# Patient Record
Sex: Male | Born: 1965 | Race: Black or African American | Hispanic: No | Marital: Single | Smoking: Former smoker
Health system: Southern US, Community
[De-identification: ages and names within clinical notes are randomized; demographics above are authoritative.]

## PROBLEM LIST (undated history)

## (undated) DIAGNOSIS — S069XAA Unspecified intracranial injury with loss of consciousness status unknown, initial encounter: Secondary | ICD-10-CM

## (undated) DIAGNOSIS — S069X9A Unspecified intracranial injury with loss of consciousness of unspecified duration, initial encounter: Secondary | ICD-10-CM

## (undated) DIAGNOSIS — E785 Hyperlipidemia, unspecified: Secondary | ICD-10-CM

## (undated) DIAGNOSIS — I10 Essential (primary) hypertension: Secondary | ICD-10-CM

## (undated) DIAGNOSIS — F1010 Alcohol abuse, uncomplicated: Secondary | ICD-10-CM

## (undated) DIAGNOSIS — F419 Anxiety disorder, unspecified: Secondary | ICD-10-CM

## (undated) HISTORY — DX: Essential (primary) hypertension: I10

## (undated) HISTORY — PX: FACIAL RECONSTRUCTION SURGERY: SHX631

## (undated) HISTORY — DX: Alcohol abuse, uncomplicated: F10.10

## (undated) HISTORY — DX: Hyperlipidemia, unspecified: E78.5

## (undated) HISTORY — DX: Unspecified intracranial injury with loss of consciousness status unknown, initial encounter: S06.9XAA

## (undated) HISTORY — DX: Unspecified intracranial injury with loss of consciousness of unspecified duration, initial encounter: S06.9X9A

---

## 1997-10-02 ENCOUNTER — Emergency Department (HOSPITAL_COMMUNITY): Admission: EM | Admit: 1997-10-02 | Discharge: 1997-10-02 | Payer: Self-pay

## 1997-10-06 ENCOUNTER — Ambulatory Visit (HOSPITAL_COMMUNITY): Admission: RE | Admit: 1997-10-06 | Discharge: 1997-10-06 | Payer: Self-pay

## 1998-07-17 ENCOUNTER — Encounter: Payer: Self-pay | Admitting: Emergency Medicine

## 1998-07-17 ENCOUNTER — Emergency Department (HOSPITAL_COMMUNITY): Admission: EM | Admit: 1998-07-17 | Discharge: 1998-07-17 | Payer: Self-pay | Admitting: Emergency Medicine

## 1998-10-28 ENCOUNTER — Encounter: Payer: Self-pay | Admitting: Emergency Medicine

## 1998-10-28 ENCOUNTER — Emergency Department (HOSPITAL_COMMUNITY): Admission: EM | Admit: 1998-10-28 | Discharge: 1998-10-28 | Payer: Self-pay | Admitting: Emergency Medicine

## 1999-08-19 ENCOUNTER — Encounter: Payer: Self-pay | Admitting: Emergency Medicine

## 1999-08-19 ENCOUNTER — Emergency Department (HOSPITAL_COMMUNITY): Admission: EM | Admit: 1999-08-19 | Discharge: 1999-08-19 | Payer: Self-pay | Admitting: Emergency Medicine

## 2002-05-25 ENCOUNTER — Encounter: Payer: Self-pay | Admitting: Emergency Medicine

## 2002-05-25 ENCOUNTER — Emergency Department (HOSPITAL_COMMUNITY): Admission: EM | Admit: 2002-05-25 | Discharge: 2002-05-25 | Payer: Self-pay | Admitting: Emergency Medicine

## 2004-03-17 ENCOUNTER — Ambulatory Visit: Payer: Self-pay | Admitting: Family Medicine

## 2004-04-07 ENCOUNTER — Ambulatory Visit: Payer: Self-pay | Admitting: Family Medicine

## 2004-05-27 ENCOUNTER — Emergency Department (HOSPITAL_COMMUNITY): Admission: EM | Admit: 2004-05-27 | Discharge: 2004-05-27 | Payer: Self-pay | Admitting: Emergency Medicine

## 2004-07-06 ENCOUNTER — Ambulatory Visit: Payer: Self-pay | Admitting: Sports Medicine

## 2004-12-17 ENCOUNTER — Ambulatory Visit: Payer: Self-pay | Admitting: Family Medicine

## 2005-12-05 ENCOUNTER — Ambulatory Visit: Payer: Self-pay | Admitting: Family Medicine

## 2006-01-04 ENCOUNTER — Ambulatory Visit: Payer: Self-pay | Admitting: Sports Medicine

## 2006-04-11 ENCOUNTER — Ambulatory Visit: Payer: Self-pay | Admitting: Family Medicine

## 2006-05-11 DIAGNOSIS — I10 Essential (primary) hypertension: Secondary | ICD-10-CM | POA: Insufficient documentation

## 2006-05-11 DIAGNOSIS — F411 Generalized anxiety disorder: Secondary | ICD-10-CM | POA: Insufficient documentation

## 2006-05-11 DIAGNOSIS — F528 Other sexual dysfunction not due to a substance or known physiological condition: Secondary | ICD-10-CM | POA: Insufficient documentation

## 2006-05-11 DIAGNOSIS — N400 Enlarged prostate without lower urinary tract symptoms: Secondary | ICD-10-CM | POA: Insufficient documentation

## 2006-09-19 ENCOUNTER — Encounter (INDEPENDENT_AMBULATORY_CARE_PROVIDER_SITE_OTHER): Payer: Self-pay | Admitting: Family Medicine

## 2006-09-19 ENCOUNTER — Ambulatory Visit (HOSPITAL_COMMUNITY): Admission: RE | Admit: 2006-09-19 | Discharge: 2006-09-19 | Payer: Self-pay | Admitting: Family Medicine

## 2006-09-19 ENCOUNTER — Ambulatory Visit: Payer: Self-pay | Admitting: Family Medicine

## 2006-09-19 LAB — CONVERTED CEMR LAB
Bilirubin Urine: NEGATIVE
Blood in Urine, dipstick: NEGATIVE
Glucose, Urine, Semiquant: NEGATIVE
Ketones, urine, test strip: NEGATIVE
Nitrite: NEGATIVE
Protein, U semiquant: NEGATIVE
Specific Gravity, Urine: 1.025
Urobilinogen, UA: 0.2
pH: 6.5

## 2006-09-21 ENCOUNTER — Encounter (INDEPENDENT_AMBULATORY_CARE_PROVIDER_SITE_OTHER): Payer: Self-pay | Admitting: *Deleted

## 2006-09-21 ENCOUNTER — Telehealth (INDEPENDENT_AMBULATORY_CARE_PROVIDER_SITE_OTHER): Payer: Self-pay | Admitting: Family Medicine

## 2006-10-17 ENCOUNTER — Ambulatory Visit (HOSPITAL_COMMUNITY): Admission: RE | Admit: 2006-10-17 | Discharge: 2006-10-17 | Payer: Self-pay | Admitting: Sports Medicine

## 2007-06-19 ENCOUNTER — Ambulatory Visit: Payer: Self-pay | Admitting: Family Medicine

## 2007-06-19 ENCOUNTER — Ambulatory Visit (HOSPITAL_COMMUNITY): Admission: RE | Admit: 2007-06-19 | Discharge: 2007-06-19 | Payer: Self-pay | Admitting: Family Medicine

## 2007-06-19 DIAGNOSIS — F17200 Nicotine dependence, unspecified, uncomplicated: Secondary | ICD-10-CM | POA: Insufficient documentation

## 2007-06-25 ENCOUNTER — Encounter (INDEPENDENT_AMBULATORY_CARE_PROVIDER_SITE_OTHER): Payer: Self-pay | Admitting: Family Medicine

## 2007-06-25 ENCOUNTER — Ambulatory Visit: Payer: Self-pay | Admitting: Sports Medicine

## 2007-06-25 LAB — CONVERTED CEMR LAB
BUN: 11 mg/dL (ref 6–23)
CO2: 25 meq/L (ref 19–32)
Calcium: 9.4 mg/dL (ref 8.4–10.5)
Chloride: 105 meq/L (ref 96–112)
Cholesterol: 204 mg/dL — ABNORMAL HIGH (ref 0–200)
Creatinine, Ser: 1.13 mg/dL (ref 0.40–1.50)
Glucose, Bld: 96 mg/dL (ref 70–99)
HDL: 76 mg/dL (ref >39)
Hgb A1c MFr Bld: 5.9 %
LDL Cholesterol: 119 mg/dL — ABNORMAL HIGH (ref 0–99)
PSA: 0.65 ng/mL (ref 0.10–4.00)
Potassium: 4.2 meq/L (ref 3.5–5.3)
Sodium: 142 meq/L (ref 135–145)
Total CHOL/HDL Ratio: 2.7
Triglycerides: 43 mg/dL (ref <150)
VLDL: 9 mg/dL (ref 0–40)

## 2007-06-26 ENCOUNTER — Encounter (INDEPENDENT_AMBULATORY_CARE_PROVIDER_SITE_OTHER): Payer: Self-pay | Admitting: Family Medicine

## 2007-06-27 ENCOUNTER — Encounter (INDEPENDENT_AMBULATORY_CARE_PROVIDER_SITE_OTHER): Payer: Self-pay | Admitting: Family Medicine

## 2008-04-03 ENCOUNTER — Ambulatory Visit: Payer: Self-pay | Admitting: Family Medicine

## 2008-09-04 ENCOUNTER — Encounter (INDEPENDENT_AMBULATORY_CARE_PROVIDER_SITE_OTHER): Payer: Self-pay | Admitting: Family Medicine

## 2008-09-04 ENCOUNTER — Ambulatory Visit: Payer: Self-pay | Admitting: Family Medicine

## 2008-09-08 LAB — CONVERTED CEMR LAB
ALT: 62 units/L — ABNORMAL HIGH (ref 0–53)
AST: 47 units/L — ABNORMAL HIGH (ref 0–37)
Albumin: 4.7 g/dL (ref 3.5–5.2)
Alkaline Phosphatase: 98 units/L (ref 39–117)
BUN: 9 mg/dL (ref 6–23)
CO2: 27 meq/L (ref 19–32)
Calcium: 9.6 mg/dL (ref 8.4–10.5)
Chloride: 103 meq/L (ref 96–112)
Cholesterol: 211 mg/dL — ABNORMAL HIGH (ref 0–200)
Creatinine, Ser: 1.03 mg/dL (ref 0.40–1.50)
Glucose, Bld: 97 mg/dL (ref 70–99)
HDL: 63 mg/dL (ref >39)
LDL Cholesterol: 134 mg/dL — ABNORMAL HIGH (ref 0–99)
PSA: 0.87 ng/mL (ref 0.10–4.00)
Potassium: 4.4 meq/L (ref 3.5–5.3)
Sodium: 140 meq/L (ref 135–145)
Total Bilirubin: 0.4 mg/dL (ref 0.3–1.2)
Total CHOL/HDL Ratio: 3.3
Total Protein: 7.3 g/dL (ref 6.0–8.3)
Triglycerides: 71 mg/dL (ref <150)
VLDL: 14 mg/dL (ref 0–40)

## 2009-06-12 ENCOUNTER — Encounter: Payer: Self-pay | Admitting: *Deleted

## 2009-06-25 ENCOUNTER — Ambulatory Visit: Payer: Self-pay | Admitting: Family Medicine

## 2009-06-25 DIAGNOSIS — R748 Abnormal levels of other serum enzymes: Secondary | ICD-10-CM | POA: Insufficient documentation

## 2009-06-25 DIAGNOSIS — E785 Hyperlipidemia, unspecified: Secondary | ICD-10-CM | POA: Insufficient documentation

## 2009-06-26 ENCOUNTER — Ambulatory Visit: Payer: Self-pay | Admitting: Family Medicine

## 2009-06-26 ENCOUNTER — Encounter: Payer: Self-pay | Admitting: Family Medicine

## 2009-06-29 ENCOUNTER — Encounter: Payer: Self-pay | Admitting: Family Medicine

## 2009-06-29 LAB — CONVERTED CEMR LAB
ALT: 53 units/L (ref 0–53)
AST: 39 units/L — ABNORMAL HIGH (ref 0–37)
Albumin: 4.6 g/dL (ref 3.5–5.2)
Alkaline Phosphatase: 83 units/L (ref 39–117)
BUN: 12 mg/dL (ref 6–23)
CO2: 26 meq/L (ref 19–32)
Calcium: 9.3 mg/dL (ref 8.4–10.5)
Chloride: 102 meq/L (ref 96–112)
Cholesterol: 191 mg/dL (ref 0–200)
Creatinine, Ser: 0.92 mg/dL (ref 0.40–1.50)
Glucose, Bld: 92 mg/dL (ref 70–99)
HCT: 40.5 % (ref 39.0–52.0)
HDL: 67 mg/dL (ref >39)
Hemoglobin: 13.2 g/dL (ref 13.0–17.0)
LDL Cholesterol: 114 mg/dL — ABNORMAL HIGH (ref 0–99)
MCHC: 32.6 g/dL (ref 30.0–36.0)
MCV: 92.7 fL (ref 78.0–100.0)
Platelets: 250 10*3/uL (ref 150–400)
Potassium: 3.9 meq/L (ref 3.5–5.3)
RBC: 4.37 M/uL (ref 4.22–5.81)
RDW: 13.3 % (ref 11.5–15.5)
Sodium: 139 meq/L (ref 135–145)
Total Bilirubin: 0.4 mg/dL (ref 0.3–1.2)
Total CHOL/HDL Ratio: 2.9
Total Protein: 7.2 g/dL (ref 6.0–8.3)
Triglycerides: 49 mg/dL (ref <150)
VLDL: 10 mg/dL (ref 0–40)
WBC: 5 10*3/uL (ref 4.0–10.5)

## 2009-10-13 ENCOUNTER — Emergency Department (HOSPITAL_COMMUNITY): Admission: EM | Admit: 2009-10-13 | Discharge: 2009-10-13 | Payer: Self-pay | Admitting: Emergency Medicine

## 2010-04-11 LAB — CONVERTED CEMR LAB
Chlamydia, Swab/Urine, PCR: NEGATIVE
GC Probe Amp, Urine: NEGATIVE
HCV Ab: NEGATIVE
Hep B S Ab: POSITIVE — AB
Hepatitis B Surface Ag: NEGATIVE

## 2010-04-15 ENCOUNTER — Encounter: Payer: Self-pay | Admitting: *Deleted

## 2010-04-15 NOTE — Letter (Signed)
Summary: Lab-Male  All     ,     Phone:   Fax:     06/29/2009        Lynita Lombard [address] [place], [place]  [address]   Dear Mr. LEDEE:  We have carefully reviewed the results of your tests noted below and the results are:  PREVENTIVE CARE TESTING:   PSA-Prostate screening, normal <4.0: 0.87 on 09/04/2008-NORMAL     Cholesterol: 191 on 06/26/2009  normal < 200   HDL "good" Cholesterol: 67 on 06/26/2009 normal >35   LDL "bad" Cholesterol: 114  on 06/26/2009 normal < 130    Triglycerides: 49 on 06/26/2009 normal < 150     Your Complete Metabolic panel was normal- your liver enzymes were within normal limits and your kidney function was normal   Your Complete Blood count which test for Anemia was normal     THE ABOVE RESULTS ARE: WITHIN NORMAL LIMITS.    Special recommendations: Please watch your alcohol intake, no more than 4 beers a day is the limit to keep a healthy liver. I will recheck your liver function in 6 months.  If you have any questions, please call. We appreciate being able to work with you.    Sincerely,   Milinda Antis MD Typed by: Milinda Antis MD  Appended Document: Lab-Male mailed.  Appended Document: Lab-Male mailed.

## 2010-04-15 NOTE — Progress Notes (Signed)
Summary: Return call  Phone Note Call from Patient Call back at Home Phone [phone]   Reason for Call: Talk to Nurse Summary of Call: pt states he is returning someone's call regarding lab results Initial call taken by: Haydee Salter,  September 21, 2006 9:11 AM  Follow-up for Phone Call        Called pt and let him know his lab results and sent a prescription for his group B strep UTI to the pharmacy Follow-up by: Levander Campion MD,  September 21, 2006 6:19 PM

## 2010-04-15 NOTE — Assessment & Plan Note (Signed)
Summary: bp med ck,tcb   Vital Signs:  Patient profile:   45 year old male Height:      68.25 inches Weight:      167 pounds BMI:     25.30 Temp:     97.7 degrees F oral Pulse rate:   82 / minute Pulse rhythm:   regular BP sitting:   138 / 100  (left arm)  Vitals Entered By: Modesta Messing LPN (September 04, 2008 10:07 AM) CC: BP meds.  Sore throat.  Pain and numbness in right hip and leg, worse at hs. Is Patient Diabetic? No Pain Assessment Patient in pain? yes     Location: Right hip and leg Intensity: 3 Type: aching Onset of pain  2 weeks, had trouble bearing weight on leg   Primary Care Provider:  Levander Campion MD  CC:  BP meds.  Sore throat.  Pain and numbness in right hip and leg and worse at hs..  History of Present Illness: Patient presents to discuss the following:  1. HTN:  has been out of meds x 2 weeks.  States when he checks BP on the meds usually diastolic is 80s.  Not sure of top number.  No CP, SOB, lightheadedness.  2.  Chest pain:  history of atypical chest pain.  was to have ETT--did not get set up.  Unchanged chest pain  3. right leg pain: had onset of right leg pain described at burning "electricity" pain originating in buttock and radiating to feet.  Has numbness when sitting down on lateral thigh and calf.  X 2 weeks, improving overall but still present.  No injury.  no back pain.  No weakness.  No bowel or bladder problems. Never happened before.    4. sore throat:  describes runny nose, sore throat, cough x 3 days.  No fever or chills.  Improving.  GF sick with similar.  ROS: neg except as above  Physical Exam  General:  Well-developed,well-nourished,in no acute distress; alert,appropriate and cooperative throughout examination thin, muscular Lungs:  Normal respiratory effort, chest expands symmetrically. Lungs are clear to auscultation, no crackles or wheezes. Heart:  Normal rate and regular rhythm. S1 and S2 normal without gallop, murmur,  click, rub or other extra sounds. Msk:  tender to palpation over right piriformis.  Nontender over lumbar spine, lateral thigh.   Negative straight leg raise. Normal and symmetrical internal and external rotation of hips BLE decreased strength on right hip abduction only, all other strength 5/5 BLE 2+ patellar reflexes BLE  Pulses:  R dorsalis pedis normal and L dorsalis pedis normal.   Extremities:  No clubbing, cyanosis, edema, or deformity noted      Impression & Recommendations:  Problem # 1:  HYPERTENSION, BENIGN SYSTEMIC (ICD-401.1) Assessment Deteriorated elevated today but off meds.  Return for RN visit when on meds x 2 weeks to recheck His updated medication list for this problem includes:    Hydrochlorothiazide 25 Mg Tabs (Hydrochlorothiazide) .Marland Kitchen... Take 1 tablet by mouth every morning  Orders: Lipid-[facility] ([address]) Comp Met-[facility] ([address]-91478) [facility]- Est  Level 4 ([address])  Problem # 2:  CHEST PAIN (ICD-786.50)  Orders: ETT (ETT)  atypical, likely MSK vs anxiety, but with risk factors of gender, smoker, HTN, FH will arrange stress test.  Had normal EKG last visit  Problem # 3:  TOBACCO USE (ICD-305.1) counseled to quit  Problem # 4:  LEG PAIN, RIGHT (ICD-729.5) Assessment: New  consistent with sciatica/piriformis syndrome.  No red flags for  hip or back.  Exercises and theraband given.  Meloxicam x 7 days.  Move wallet to front pocket  Orders: [facility]- Est  Level 4 ([address])  Problem # 5:  URI (ICD-465.9) Assessment: New  symptoms consistent with viral URI, supportive care His updated medication list for this problem includes:    Meloxicam 15 Mg Tabs (Meloxicam) .Marland Kitchen... 1 by mouth daily with food x 7 days, then as needed for leg pain  Orders: [facility]- Est  Level 4 ([address])  Complete Medication List: 1)  Hydrochlorothiazide 25 Mg Tabs (Hydrochlorothiazide) .... Take 1 tablet by mouth every morning 2)  Viagra 50 Mg Tabs (Sildenafil citrate) .... Take 1 tablet by mouth as  directed 3)  Meloxicam 15 Mg Tabs (Meloxicam) .Marland Kitchen.. 1 by mouth daily with food x 7 days, then as needed for leg pain  Other Orders: PSA-[facility] ([address]11-[address]1478)  Patient Instructions: 1)  Please schedule a follow-up appointment in 2 weeks for nurse visit for blood pressure check after restarting your medicines. 2)  Make a follow up appointment for blood pressure in 1-2 months. 3)  We will contact you about having the stress test for chest pain. 4)  Perform exercises to relieve the irritation of your sciatic nerve causing your leg pain and numbness three sets twice daily. 5)  Start wearing your wallet in your front pocket. 6)  Take meloxicam once daily x 7 days.  This is $4 at [facility]. 7)  We are checking your cholesterol and prostate today--we will let you know these results. 8)  If your cold symptoms are not better by next week, or you have fever or trouble breathing, return to see [place]. Prescriptions: VIAGRA 50 MG TABS (SILDENAFIL CITRATE) Take 1 tablet by mouth as directed  #10 x 11   Entered and Authorized by:   Levander Campion MD   Signed by:   Levander Campion MD on 09/04/2008   Method used:   Electronically to        [facility] [phone]* (retail)       [address]       [place], [place]  [address]       Ph: [phone]       Fax: [phone]   RxID:   [id number] HYDROCHLOROTHIAZIDE 25 MG TABS (HYDROCHLOROTHIAZIDE) Take 1 tablet by mouth every morning  #31 x 11   Entered and Authorized by:   Levander Campion MD   Signed by:   Levander Campion MD on 09/04/2008   Method used:   Electronically to        [facility] [phone]* (retail)       [address]       [place], [place]  [address]       Ph: [phone]       Fax: [phone]   RxID:   [id number] MELOXICAM 15 MG TABS (MELOXICAM) 1 by mouth daily with food x 7 days, then as needed for leg pain  #15 x 0   Entered and Authorized by:   Levander Campion MD   Signed by:   Levander Campion MD on 09/04/2008   Method  used:   Electronically to        [facility] [phone]* (retail)       [address]       [place], [place]  [address]       Ph: [phone]       Fax: [phone]   RxID:   [id number]

## 2010-04-15 NOTE — Assessment & Plan Note (Signed)
Summary: FU/KH   Vital Signs:  Patient Profile:   45 Years Old Male Height:     68.25 inches Weight:      160.2 pounds Temp:     98.1 degrees F Pulse rate:   75 / minute BP sitting:   138 / 95  (left arm)  Pt. in pain?   no  Vitals Entered By: Jacki Cones RN (April 03, 2008 11:24 AM)                   PCP:  Levander Campion MD  Chief Complaint:  f/u meds, occassional headaches, and and twinges in chest.  History of Present Illness: Patient presents after not being seen for approx 9 months.  We discussed the following:  1. HTN:  did not take HCTZ this AM.  normally takes without problems.  Does not take BP at home.  No shortness of breath, edema.  Does report chest pains--see below.  2. CHest pain:  describes similar chest pains to what he has described before.  THey are substernal, sharp, do not radiate, last a couple of hours, come on randomly with no relation to exertion or eating, do seem to be related to stress. Not pleuritic.  He has significant stress from being unemployed.  Not associated with shortness of breath, nausea, diaphoresis.  exercises regularly by lifting weights and playing bball or football.  No palpitations.   3. anxiety:  has significant history of anxiety, notes worrying all the time.  uses exercise to relieve stress.  stress is mostly from being unemployed.  Prescribed paxil before, but patient did not take, feels like he can manage without meds.  4. tobacco: smoking 2 cigarettes per day--had quit last year.  smokes them at night.  wants to quit       Risk Factors:  Tobacco use:  current    Physical Exam  General:     Well-developed,well-nourished,in no acute distress; alert,appropriate and cooperative throughout examination thin, muscular Neck:     no jvd Chest Wall:     no tenderness.   Lungs:     Normal respiratory effort, chest expands symmetrically. Lungs are clear to auscultation, no crackles or wheezes. Heart:     Normal rate  and regular rhythm. S1 and S2 normal without gallop, murmur, click, rub or other extra sounds. Pulses:     R dorsalis pedis normal and L dorsalis pedis normal.   Extremities:     No clubbing, cyanosis, edema, or deformity noted    Psych:     slightly anxious.      Impression & Recommendations:  Problem # 1:  HYPERTENSION, BENIGN SYSTEMIC (ICD-401.1) Assessment: Unchanged has not taken meds this AM.  repeat BP 142/88.  Recheck in 3 months, encouraged patient to take BP meds before appt His updated medication list for this problem includes:    Hydrochlorothiazide 25 Mg Tabs (Hydrochlorothiazide) .Marland Kitchen... Take 1 tablet by mouth every morning  Orders: [facility]- Est  Level 4 ([address]782)   Problem # 2:  CHEST PAIN (ICD-786.50) Assessment: Unchanged atypical, likely MSK vs anxiety, but with risk factors of gender, smoker, HTN, FH will arrange stress test.  Had normal EKG last visit Orders: [facility]- Est  Level 4 ([address]21)   Problem # 3:  TOBACCO USE (ICD-305.1) Assessment: Deteriorated encouraged cessation Orders: [facility]- Est  Level 4 (30865)   Problem # 4:  ANXIETY (ICD-300.00) Assessment: Unchanged offered paxil, but patient will continue to manage on his own for now.  no depression symptoms His updated medication list for this problem includes:    Paxil 20 Mg Tabs (Paroxetine hcl) .Marland Kitchen... Take 1 tablet by mouth once a day  Orders: [facility]- Est  Level 4 ([address]295)   Complete Medication List: 1)  Hydrochlorothiazide 25 Mg Tabs (Hydrochlorothiazide) .... Take 1 tablet by mouth every morning 2)  Paxil 20 Mg Tabs (Paroxetine hcl) .... Take 1 tablet by mouth once a day 3)  Viagra 50 Mg Tabs (Sildenafil citrate) .... Take 1 tablet by mouth as directed  Other Orders: Influenza Vaccine NON MCR (28413)   Patient Instructions: 1)  Make a follow up appointment in April to recheck blood pressure, cholesterol, and prostate. 2)  Start regular exercise again -- this will help both your heart and your  anxiety. 3)  Continue to work on quitting smoking! 4)  Continue your HCTZ. 5)  [facility] will call you about setting up your stress test.     Influenza Vaccine    Vaccine Type: Fluvax Non-MCR    Site: left deltoid    Mfr: Sanofi Pasteur    Dose: 0.5 ml    Route: IM    Given by: AMY MARTIN RN    Exp. Date: 09/10/2008    Lot #: [id number]    VIS given: 10/05/06 version given April 03, 2008.  Flu Vaccine Consent Questions    Do you have a history of severe allergic reactions to this vaccine? no    Any prior history of allergic reactions to egg and/or gelatin? no    Do you have a sensitivity to the preservative Thimersol? no    Do you have a past history of Guillan-Barre Syndrome? no    Do you currently have an acute febrile illness? no    Have you ever had a severe reaction to latex? no    Vaccine information given and explained to patient? yes
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
## 2010-04-15 NOTE — Letter (Signed)
Summary: Generic Letter  [facility]  [address]   [place], [place] [address]   Phone: [phone]  Fax: [phone]    06/26/2007  AIDENN SKELLENGER [address] [place], [place]  [address]  Dear Mr. MEMOLI,  All of your lab tests came back normal.  Your cholesterol is at goal for you, and you do not have diabetes.  Your prostate test was normal as well.  I look forward to seeing you at your next visit.         Sincerely,   Levander Campion MD [facility]  Appended Document: Generic Letter Letter sent via mail to pt ..................Marland KitchenDelores Pate-Gaddy, CMA (AAMA)

## 2010-04-15 NOTE — Letter (Signed)
Summary: Probation Letter  [facility]  [address]   [place], [place] [address]   Phone: [phone]  Fax: [phone]    06/12/2009  George Schroeder [address] [place], [place]  [address]  Dear [name],  With the goal of better serving all our patients the [facility] is following each patient's missed appointments.  You have missed at least 3 appointments with our practice.If you cannot keep your appointment, we expect you to call at least 24 hours before your appointment time.  Missing appointments prevents other patients from seeing [place] and makes it difficult to provide you with the best possible medical care.      1.   If you miss one more appointment, we will only give you limited medical services. This means we will not call in medication refills, complete a form, or make a referral for you except when you are here for a scheduled office visit.    2.   If you miss 2 or more appointments in the next year, we will dismiss you from our practice.    Our office staff can be reached at [phone] Monday through Friday from 8:30 a.m.-5:00 p.m. and will be glad to schedule your appointment as necessary.    Thank you.   [facility]

## 2010-04-15 NOTE — Miscellaneous (Signed)
Summary: ETT APPT  STRESS TEST SCHEDULED FOR: October 17, 2006 AT 11:15AM AT [facility] INSTRUCTIONS AND APPT MAILED TO PT

## 2010-04-15 NOTE — Assessment & Plan Note (Signed)
Summary: FU MEDS/KH   Vital Signs:  Patient Profile:   45 Years Old Male Height:     68.25 inches Weight:      144 pounds Temp:     98.2 degrees F Pulse rate:   77 / minute BP sitting:   126 / 84  (left arm)  Pt. in pain?   yes    Location:   chest     Intensity:   2    Type:       sharp  Vitals Entered By: Theresia Lo RN (June 19, 2007 11:55 AM)              Is Patient Diabetic? No     PCP:  Levander Campion MD  Chief Complaint:  check up and chest pain.  History of Present Illness: George Schroeder is here to follow up for his HTN.  He also mentions he is having some chest pains. 1. Chest pain:  reports sharp shooting pain in left chest region that last 1-2 seconds.  Not brought on by exertion and not relieved by rest.  Happens spontaneously and goes away.  Not related to eating.  denies GERD symptoms.  Has started working out lately by lifting weights.  Does report that it comes more frequently when he is feeling anxious.  Denies nausea, diaphoresis, shortness of breath, radiation of pain. 2. HTN:  continues on HCTZ without problems.  Denies shortness of breath or edema.  Occasional lightheadedness upon standing.  Does not take BP at home.  Follow low salt diet.  Exercises by walking and lifting weights daily. 3. tobacco abuse: planning to quit today.   4. anxiety: not taking paxil anymore because he does not like to take meds.  Does report increased worry and anxiousness lately, especially regarding money.  His hours at work were recently decreased, which has made it hard for him to pay his bills.      Risk Factors:  Tobacco use:  quit    Physical Exam  General:     Well-developed,well-nourished,in no acute distress; alert,appropriate and cooperative throughout examination thin, muscular Neck:     no jvd Chest Wall:     no tenderness.   Lungs:     Normal respiratory effort, chest expands symmetrically. Lungs are clear to auscultation, no crackles or  wheezes. Heart:     Normal rate and regular rhythm. S1 and S2 normal without gallop, murmur, click, rub or other extra sounds. Pulses:     R dorsalis pedis normal and L dorsalis pedis normal.   Extremities:     No clubbing, cyanosis, edema, or deformity noted    Psych:     Oriented X3, memory intact for recent and remote, normally interactive, good eye contact, and slightly anxious.      Impression & Recommendations:  Problem # 1:  CHEST PAIN (ICD-786.50) Assessment: Unchanged EKG NSR with no ischemic changes.  Chest pain very atypical.  Likely muscle spasm.  Very unlikely cardiac.  Has had this type of chest pain in the past, and we set up ETT.  He did not go, but is willing to do this at this time.  Risk factors include age, male gender, smoking, hypertension. Orders: [facility]- Est  Level 4 (16109)   Problem # 2:  HYPERTENSION, BENIGN SYSTEMIC (ICD-401.1) excellent control. continue HCTZ.  advised to get up slowly to avoid lightheadedness.  If continues, may need to cut back on meds. His updated medication list for this problem includes:  Hydrochlorothiazide 25 Mg Tabs (Hydrochlorothiazide) .Marland Kitchen... Take 1 tablet by mouth every morning  Orders: [facility]- Est  Level 4 (01027)  Future Orders: Lipid-[facility] ([id number]) ... 06/12/2008 Basic Met-[facility] ([id number]) ... 06/12/2008 A1C-[facility] (56433) ... 06/18/2008   Problem # 3:  ANXIETY (ICD-300.00) Assessment: Deteriorated encouraged to restart paxil, as this really helped patient in the past.   His updated medication list for this problem includes:    Paxil 20 Mg Tabs (Paroxetine hcl) .Marland Kitchen... Take 1 tablet by mouth once a day  Orders: [facility]- Est  Level 4 (29518)   Problem # 4:  Screening PSA (ICD-V76.44)  Problem # 5:  Preventive Health Care (ICD-V70.0) Assessment: Comment Only due for cholesterol, PSA, blood sugar check  Problem # 6:  TOBACCO USE (ICD-305.1) Assessment: Improved today is patient's quit date.  encouragement and support  provided. planning to quit on his own, but offered assistance if he desires Orders: [facility]- Est  Level 4 (84166)   Complete Medication List: 1)  Hydrochlorothiazide 25 Mg Tabs (Hydrochlorothiazide) .... Take 1 tablet by mouth every morning 2)  Paxil 20 Mg Tabs (Paroxetine hcl) .... Take 1 tablet by mouth once a day 3)  Viagra 50 Mg Tabs (Sildenafil citrate) .... Take 1 tablet by mouth as directed  Other Orders: Future Orders: PSA-[facility] ([id number]) ... 06/18/2008   Patient Instructions: 1)  Make an appointment with the lab for tomorrow morning to check your cholesterol, blood sugar, and prostate. 2)  We will contact you about having a stress test. 3)  I am confident your chest pain is not coming from your heart. 4)  Keep taking your HCTZ. 5)  Restart paxil to help with nerves. 6)  I am so excited you are going to quit smoking--let us know if we can help you. 7)  I will let you know the results of your lab tests.    Prescriptions: VIAGRA 50 MG TABS (SILDENAFIL CITRATE) Take 1 tablet by mouth as directed  #10 x 11   Entered and Authorized by:   Levander Campion MD   Signed by:   Levander Campion MD on 06/19/2007   Method used:   Print then Give to Patient   RxID:   [id number] PAXIL 20 MG TABS (PAROXETINE HCL) Take 1 tablet by mouth once a day  #31 x 11   Entered and Authorized by:   Levander Campion MD   Signed by:   Levander Campion MD on 06/19/2007   Method used:   Print then Give to Patient   RxID:   [id number] HYDROCHLOROTHIAZIDE 25 MG TABS (HYDROCHLOROTHIAZIDE) Take 1 tablet by mouth every morning  #31 x 11   Entered and Authorized by:   Levander Campion MD   Signed by:   Levander Campion MD on 06/19/2007   Method used:   Print then Give to Patient   RxID:   [id number]  ]

## 2010-04-15 NOTE — Assessment & Plan Note (Signed)
Summary: BP F/U/REFILLS/[facility]   Vital Signs:  Patient Profile:   45 Years Old Male Weight:      146 pounds Temp:     98.2 degrees F Pulse rate:   72 / minute BP sitting:   132 / 93  (left arm)  Pt. in pain?   no  Vitals Entered By: Jacki Cones RN (September 19, 2006 1:38 PM)                PCP:  Levander Campion MD  Chief Complaint:  f/u bp and has chest tightness twice in last 2 days.  History of Present Illness: 1. pt describes substernal chest tightness that started 2 days ago, does not radiate.  Possibly exacerbated by eating.  Patient does endorse some acid reflex symptoms. Not associated with nausea, diaphoresis, or shortness of breath.  Not brought on by exertion. No palpitations. Not relieved by rest.  Sometimes exacerbated by flexing chest muscles. This AM, pain was gone when patient awakened, and has not returned.  2. Patient is concerned about STDs, as has had mulitple partners in the past with possible exposure to STDs. No symptoms. would like testing.  3. UTI: patient had a UTI in Jan and did not pick up his antibiotic because he was concerned it would interact with his HCTZ.  Currently having some dysuria.  No fevers or chills.      Risk Factors:  Tobacco use:  current    Cigarettes:  Yes -- 0.25 ppd pack(s) per day    Counseled to quit/cut down tobacco use:  yes    Physical Exam  General:     Well-developed,well-nourished,in no acute distress; alert,appropriate and cooperative throughout examination Neck:     supple, full ROM, and no JVD.   Chest Wall:     no tenderness.   Lungs:     Normal respiratory effort, chest expands symmetrically. Lungs are clear to auscultation, no crackles or wheezes. Heart:     Normal rate and regular rhythm. S1 and S2 normal without gallop, murmur, click, rub or other extra sounds. Abdomen:     Bowel sounds positive,abdomen soft and non-tender without masses, organomegaly or hernias noted. Pulses:     2+ DP pulses  bilaterally Extremities:     No clubbing, cyanosis, edema, or deformity noted with normal full range of motion of all joints.   Psych:     moderately anxious.      Impression & Recommendations:  Problem # 1:  CHEST PAIN (ICD-786.50) Assessment: New Doubt pain is cardiac in nature given characteristics, but given male gender, smoking, and hypertension, would recommend exercise treadmill test to rule out cardiac ischemia.  Will get baseline EKG today. Currently chest pain free.  Most likely GERD vs anxiety vs MSK. recommend trying prilosec. Orders: ETT (ETT) [facility]- Est  Level 4 (16109)   Problem # 2:  HX, URINARY INFECTION (ICD-V13.02) will recheck urine culture today, and prescribe abx if necessary. Explained importance of treating infection, and safety of antibiotic. Orders: Urinalysis-[facility] (00000) Urine Culture-[facility] ([id number]) [facility]- Est  Level 4 (99214)   Problem # 3:  EXPOSURE TO COMMUNICABLE DISEASE NOS (ICD-V01.9) Given possible exposure in the past, will check for stds. Orders: HIV-[facility] ([id number]) Hep C Ab-[facility] ([id number]) Hep Bs Ag-[facility] (96295-28413) Hep Bs Ab-[facility] (24401-02725) RPR-[facility] (36644-03474) GC/Chlamydia-[facility] (87591/87491) [facility]- Est  Level 4 (25956)    Patient Instructions: 1)  I will call you about your test results. 2)  Continue your HCTZ as before. 3)  Try over the counter prilosec (omeprazole) for reflux. 4)  Amy will scedule your exercise stress test. 5)  Tobacco is very bad for your health and your loved ones! You Should stop smoking!. 6)  Stop Smoking Tips: Choose a Quit date. Cut down before the Quit date. decide what you will do as a substitute when you feel the urge to smoke(gum,toothpick,exercise).        Laboratory Results   Urine Tests  Date/Time Recieved: September 19, 2006 2:19 PM  Date/Time Reported: September 19, 2006 3:05 PM   Routine Urinalysis   Color: yellow Appearance: Clear Glucose: negative   (Normal Range:  Negative) Bilirubin: negative   (Normal Range: Negative) Ketone: negative   (Normal Range: Negative) Spec. Gravity: 1.025   (Normal Range: 1.003-1.035) Blood: negative   (Normal Range: Negative) pH: 6.5   (Normal Range: 5.0-8.0) Protein: negative   (Normal Range: Negative) Urobilinogen: 0.2   (Normal Range: 0-1) Nitrite: negative   (Normal Range: Negative) Leukocyte Esterace: small   (Normal Range: Negative)  Urine Microscopic WBC/hpf: 5-10 RBC/hpf: occ Bacteria: trace Mucous: small Epithelial: rare    Comments: urine sent for culture ...................................................................DONNA [facility]  September 19, 2006 3:05 PM

## 2010-05-28 LAB — POCT URINALYSIS DIPSTICK
Bilirubin Urine: NEGATIVE
Glucose, UA: NEGATIVE mg/dL
Hgb urine dipstick: NEGATIVE
Nitrite: NEGATIVE
Protein, ur: 30 mg/dL — AB
Specific Gravity, Urine: 1.025 (ref 1.005–1.030)
Urobilinogen, UA: 2 mg/dL — ABNORMAL HIGH (ref 0.0–1.0)
pH: 6 (ref 5.0–8.0)

## 2010-05-28 LAB — URINE CULTURE
Colony Count: 25000
Culture  Setup Time: 201108030424

## 2010-10-11 ENCOUNTER — Other Ambulatory Visit: Payer: Self-pay | Admitting: Family Medicine

## 2010-10-11 MED ORDER — HYDROCHLOROTHIAZIDE 25 MG PO TABS: 25.0000 mg | ORAL_TABLET | Freq: Every day | ORAL | Status: DC

## 2010-11-28 ENCOUNTER — Emergency Department (HOSPITAL_COMMUNITY)
Admission: EM | Admit: 2010-11-28 | Discharge: 2010-11-29 | Disposition: A | Discharge status: Other Acute Inpatient Hospital | Payer: Self-pay | Source: Home / Self Care | Attending: Emergency Medicine | Admitting: Emergency Medicine

## 2010-11-28 ENCOUNTER — Emergency Department (HOSPITAL_COMMUNITY): Payer: Self-pay

## 2010-11-28 DIAGNOSIS — R0902 Hypoxemia: Secondary | ICD-10-CM | POA: Insufficient documentation

## 2010-11-28 DIAGNOSIS — R079 Chest pain, unspecified: Secondary | ICD-10-CM | POA: Insufficient documentation

## 2010-11-28 DIAGNOSIS — R000 Tachycardia, unspecified: Secondary | ICD-10-CM | POA: Insufficient documentation

## 2010-11-28 DIAGNOSIS — R059 Cough, unspecified: Secondary | ICD-10-CM | POA: Insufficient documentation

## 2010-11-28 DIAGNOSIS — R05 Cough: Secondary | ICD-10-CM | POA: Insufficient documentation

## 2010-11-28 DIAGNOSIS — B9789 Other viral agents as the cause of diseases classified elsewhere: Secondary | ICD-10-CM | POA: Insufficient documentation

## 2010-11-28 DIAGNOSIS — R509 Fever, unspecified: Secondary | ICD-10-CM | POA: Insufficient documentation

## 2010-11-28 DIAGNOSIS — R51 Headache: Secondary | ICD-10-CM | POA: Insufficient documentation

## 2010-11-28 DIAGNOSIS — I10 Essential (primary) hypertension: Secondary | ICD-10-CM | POA: Insufficient documentation

## 2010-11-28 LAB — COMPREHENSIVE METABOLIC PANEL
ALT: 42 U/L (ref 0–53)
AST: 32 U/L (ref 0–37)
Albumin: 3.7 g/dL (ref 3.5–5.2)
Alkaline Phosphatase: 113 U/L (ref 39–117)
BUN: 12 mg/dL (ref 6–23)
CO2: 26 mEq/L (ref 19–32)
Calcium: 9.1 mg/dL (ref 8.4–10.5)
Chloride: 95 mEq/L — ABNORMAL LOW (ref 96–112)
Creatinine, Ser: 0.96 mg/dL (ref 0.50–1.35)
GFR calc Af Amer: >60 mL/min (ref >60)
GFR calc non Af Amer: >60 mL/min (ref >60)
Glucose, Bld: 103 mg/dL — ABNORMAL HIGH (ref 70–99)
Potassium: 3.3 mEq/L — ABNORMAL LOW (ref 3.5–5.1)
Sodium: 134 mEq/L — ABNORMAL LOW (ref 135–145)
Total Bilirubin: 0.5 mg/dL (ref 0.3–1.2)
Total Protein: 8.1 g/dL (ref 6.0–8.3)

## 2010-11-28 LAB — DIFFERENTIAL
Basophils Absolute: 0 10*3/uL (ref 0.0–0.1)
Basophils Relative: 0 % (ref 0–1)
Eosinophils Absolute: 0 10*3/uL (ref 0.0–0.7)
Eosinophils Relative: 0 % (ref 0–5)
Lymphocytes Relative: 12 % (ref 12–46)
Lymphs Abs: 1.6 10*3/uL (ref 0.7–4.0)
Monocytes Absolute: 1.2 10*3/uL — ABNORMAL HIGH (ref 0.1–1.0)
Monocytes Relative: 9 % (ref 3–12)
Neutro Abs: 10.3 10*3/uL — ABNORMAL HIGH (ref 1.7–7.7)
Neutrophils Relative %: 78 % — ABNORMAL HIGH (ref 43–77)

## 2010-11-28 LAB — CBC
HCT: 41.7 % (ref 39.0–52.0)
Hemoglobin: 14.4 g/dL (ref 13.0–17.0)
MCH: 31.9 pg (ref 26.0–34.0)
MCHC: 34.5 g/dL (ref 30.0–36.0)
MCV: 92.3 fL (ref 78.0–100.0)
Platelets: 225 10*3/uL (ref 150–400)
RBC: 4.52 MIL/uL (ref 4.22–5.81)
RDW: 13.9 % (ref 11.5–15.5)
WBC: 13.1 10*3/uL — ABNORMAL HIGH (ref 4.0–10.5)

## 2010-11-28 LAB — LIPASE, BLOOD: Lipase: 17 U/L (ref 11–59)

## 2010-11-29 ENCOUNTER — Inpatient Hospital Stay (HOSPITAL_COMMUNITY): Payer: Self-pay

## 2010-11-29 ENCOUNTER — Inpatient Hospital Stay (HOSPITAL_COMMUNITY)
Admission: RE | Admit: 2010-11-29 | Discharge: 2010-12-02 | DRG: 195 | Disposition: A | Discharge status: Home Health | Payer: Self-pay | Source: Ambulatory Visit | Attending: Family Medicine | Admitting: Family Medicine

## 2010-11-29 ENCOUNTER — Encounter: Payer: Self-pay | Admitting: Family Medicine

## 2010-11-29 DIAGNOSIS — I10 Essential (primary) hypertension: Secondary | ICD-10-CM | POA: Diagnosis present

## 2010-11-29 DIAGNOSIS — F17200 Nicotine dependence, unspecified, uncomplicated: Secondary | ICD-10-CM | POA: Diagnosis present

## 2010-11-29 DIAGNOSIS — J159 Unspecified bacterial pneumonia: Secondary | ICD-10-CM

## 2010-11-29 DIAGNOSIS — F1010 Alcohol abuse, uncomplicated: Secondary | ICD-10-CM | POA: Diagnosis present

## 2010-11-29 DIAGNOSIS — J189 Pneumonia, unspecified organism: Principal | ICD-10-CM | POA: Diagnosis present

## 2010-11-29 DIAGNOSIS — Z79899 Other long term (current) drug therapy: Secondary | ICD-10-CM

## 2010-11-29 DIAGNOSIS — E876 Hypokalemia: Secondary | ICD-10-CM | POA: Diagnosis present

## 2010-11-29 DIAGNOSIS — R000 Tachycardia, unspecified: Secondary | ICD-10-CM | POA: Diagnosis present

## 2010-11-29 DIAGNOSIS — Z23 Encounter for immunization: Secondary | ICD-10-CM

## 2010-11-29 DIAGNOSIS — R0902 Hypoxemia: Secondary | ICD-10-CM | POA: Diagnosis present

## 2010-11-29 LAB — COMPREHENSIVE METABOLIC PANEL
ALT: 32 U/L (ref 0–53)
AST: 23 U/L (ref 0–37)
Albumin: 3.1 g/dL — ABNORMAL LOW (ref 3.5–5.2)
Alkaline Phosphatase: 97 U/L (ref 39–117)
BUN: 11 mg/dL (ref 6–23)
CO2: 29 mEq/L (ref 19–32)
Calcium: 8.7 mg/dL (ref 8.4–10.5)
Chloride: 97 mEq/L (ref 96–112)
Creatinine, Ser: 0.98 mg/dL (ref 0.50–1.35)
GFR calc Af Amer: >60 mL/min (ref >60)
GFR calc non Af Amer: >60 mL/min (ref >60)
Glucose, Bld: 101 mg/dL — ABNORMAL HIGH (ref 70–99)
Potassium: 3.4 mEq/L — ABNORMAL LOW (ref 3.5–5.1)
Sodium: 134 mEq/L — ABNORMAL LOW (ref 135–145)
Total Bilirubin: 0.6 mg/dL (ref 0.3–1.2)
Total Protein: 7.1 g/dL (ref 6.0–8.3)

## 2010-11-29 LAB — CBC
HCT: 39.5 % (ref 39.0–52.0)
Hemoglobin: 13.5 g/dL (ref 13.0–17.0)
MCH: 31.7 pg (ref 26.0–34.0)
MCHC: 34.2 g/dL (ref 30.0–36.0)
MCV: 92.7 fL (ref 78.0–100.0)
Platelets: 204 10*3/uL (ref 150–400)
RBC: 4.26 MIL/uL (ref 4.22–5.81)
RDW: 13.7 % (ref 11.5–15.5)
WBC: 9.4 10*3/uL (ref 4.0–10.5)

## 2010-11-29 LAB — INFLUENZA PANEL BY PCR (TYPE A & B)
H1N1 flu by pcr: NOT DETECTED
Influenza A By PCR: NEGATIVE
Influenza B By PCR: NEGATIVE

## 2010-11-29 LAB — GRAM STAIN

## 2010-11-29 LAB — URINALYSIS, ROUTINE W REFLEX MICROSCOPIC
Bilirubin Urine: NEGATIVE
Glucose, UA: NEGATIVE mg/dL
Hgb urine dipstick: NEGATIVE
Ketones, ur: NEGATIVE mg/dL
Leukocytes, UA: NEGATIVE
Nitrite: NEGATIVE
Protein, ur: NEGATIVE mg/dL
Specific Gravity, Urine: 1.02 (ref 1.005–1.030)
Urobilinogen, UA: 1 mg/dL (ref 0.0–1.0)
pH: 7 (ref 5.0–8.0)

## 2010-11-29 LAB — PROTIME-INR
INR: 1.08 (ref 0.00–1.49)
Prothrombin Time: 14.2 seconds (ref 11.6–15.2)

## 2010-11-29 LAB — CARDIAC PANEL(CRET KIN+CKTOT+MB+TROPI)
CK, MB: 2.1 ng/mL (ref 0.3–4.0)
Relative Index: 1 (ref 0.0–2.5)
Total CK: 210 U/L (ref 7–232)
Troponin I: <0.3 ng/mL (ref <0.30)

## 2010-11-29 LAB — APTT: aPTT: 35 seconds (ref 24–37)

## 2010-11-29 LAB — CK TOTAL AND CKMB (NOT AT ARMC)
CK, MB: 1.3 ng/mL (ref 0.3–4.0)
Relative Index: 0.7 (ref 0.0–2.5)
Total CK: 179 U/L (ref 7–232)

## 2010-11-29 LAB — TROPONIN I: Troponin I: <0.3 ng/mL (ref <0.30)
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
## 2010-11-30 LAB — URINE CULTURE
Colony Count: NO GROWTH
Culture  Setup Time: 201209170639
Culture: NO GROWTH

## 2010-11-30 LAB — CBC
HCT: 37.4 % — ABNORMAL LOW (ref 39.0–52.0)
Hemoglobin: 12.8 g/dL — ABNORMAL LOW (ref 13.0–17.0)
MCH: 31.3 pg (ref 26.0–34.0)
MCHC: 34.2 g/dL (ref 30.0–36.0)
MCV: 91.4 fL (ref 78.0–100.0)
Platelets: 182 10*3/uL (ref 150–400)
RBC: 4.09 MIL/uL — ABNORMAL LOW (ref 4.22–5.81)
RDW: 13.6 % (ref 11.5–15.5)
WBC: 10.3 10*3/uL (ref 4.0–10.5)

## 2010-11-30 LAB — BASIC METABOLIC PANEL
BUN: 10 mg/dL (ref 6–23)
CO2: 24 mEq/L (ref 19–32)
Calcium: 8.7 mg/dL (ref 8.4–10.5)
Chloride: 98 mEq/L (ref 96–112)
Creatinine, Ser: 1.03 mg/dL (ref 0.50–1.35)
GFR calc Af Amer: >60 mL/min (ref >60)
GFR calc non Af Amer: >60 mL/min (ref >60)
Glucose, Bld: 112 mg/dL — ABNORMAL HIGH (ref 70–99)
Potassium: 4 mEq/L (ref 3.5–5.1)
Sodium: 132 mEq/L — ABNORMAL LOW (ref 135–145)

## 2010-12-01 LAB — CBC
HCT: 37.7 % — ABNORMAL LOW (ref 39.0–52.0)
Hemoglobin: 12.8 g/dL — ABNORMAL LOW (ref 13.0–17.0)
MCH: 31 pg (ref 26.0–34.0)
MCHC: 34 g/dL (ref 30.0–36.0)
MCV: 91.3 fL (ref 78.0–100.0)
Platelets: 214 10*3/uL (ref 150–400)
RBC: 4.13 MIL/uL — ABNORMAL LOW (ref 4.22–5.81)
RDW: 13.8 % (ref 11.5–15.5)
WBC: 10.9 10*3/uL — ABNORMAL HIGH (ref 4.0–10.5)

## 2010-12-01 LAB — BASIC METABOLIC PANEL
BUN: 11 mg/dL (ref 6–23)
CO2: 24 mEq/L (ref 19–32)
Calcium: 8.7 mg/dL (ref 8.4–10.5)
Chloride: 99 mEq/L (ref 96–112)
Creatinine, Ser: 0.93 mg/dL (ref 0.50–1.35)
GFR calc Af Amer: >60 mL/min (ref >60)
GFR calc non Af Amer: >60 mL/min (ref >60)
Glucose, Bld: 102 mg/dL — ABNORMAL HIGH (ref 70–99)
Potassium: 4.1 mEq/L (ref 3.5–5.1)
Sodium: 134 mEq/L — ABNORMAL LOW (ref 135–145)

## 2010-12-01 LAB — HIV ANTIBODY (ROUTINE TESTING W REFLEX): HIV: NONREACTIVE

## 2010-12-02 ENCOUNTER — Inpatient Hospital Stay (HOSPITAL_COMMUNITY): Payer: Self-pay

## 2010-12-03 NOTE — Discharge Summary (Signed)
Physician Discharge Summary  Patient ID: George Schroeder MRN: [id number] DOB/AGE: 1965-08-21 45 y.o.  Admit date: 11/29/10 Discharge date: 12/02/10  Admission Diagnoses: Respiratory Distress   Discharge Diagnoses: 1.  Pneumonia, aspiration 2. Hypertension 3. Alcohol Abuse  Discharged Condition: good  Hospital Course: Mr. Limas presented to the ED at [facility] with a one day history of fever after heaving alcohol consumption. He was found to be febrile and in mild respiratory distress. A chest X-ray was negative for pneumonia on 9/16. However, on 9/17 a chest X-ray demonstrated a left lower lobe opacity consistent. The patient was started on Rocephin and Azithromycin for community acquired pneumonia. However, the patient continued to be febrile to 103. Therefore, Clindamycin was started for suspected aspiration pneumonia. The patient was also hypertensive throughout his hospitalization despite taking Metoprolol 50mg  BID. Therefore, HCTZ was added to his regimen.   Consults: Social work and case management   Significant Diagnostic Studies: microbiology: blood culture: NGTD, but final on 12/04/10 and radiology: CXR: infiltrates: lower lobe on the left on 9/17,   Treatments: antibiotics: Rocephin 1 gm IV x 4 days, Azithromycin 500mg   BID x 4 days, Clindamycin 600 mg IV BID x 3 days  D/C Meds:  1. Ciprofloxacin 500 mg PO BID x 7 days 2. Clindamycin 300 mg PO, 2 tabs TID x 7 days 3. Metoprolol 25 mg PO BID 4. HCTZ 25mg  daily 5. Sildenafil 50 mg daily as needed   Discharge Exam: At discharge the T: 98.3, BP: 130/91 RR:18 O2 96% on RA P: 82   Disposition: Home , the patient will also follow-up with the [facility] for application for government resources;   Discharge Orders    Future Appointments: Provider: Department: Dept Phone: Center:   12/14/2010 1:45 PM Clementeen Graham [facility] Resident [phone] [facility]         Signed: Mat Carne 12/03/2010,  5:31 AM

## 2010-12-05 LAB — CULTURE, BLOOD (ROUTINE X 2)
Culture  Setup Time: 201209172220
Culture  Setup Time: 201209172220
Culture: NO GROWTH
Culture: NO GROWTH

## 2010-12-06 NOTE — Discharge Summary (Addendum)
  George Schroeder, George Schroeder                ACCOUNT NO.:  [id number]  MEDICAL RECORD NO.:  [id number]  LOCATION:  [facility]                         FACILITY:  [facility]  PHYSICIAN:  Santiago Bumpers. Hensel, M.D.DATE OF BIRTH:  1965-11-11  DATE OF ADMISSION:  11/29/2010 DATE OF DISCHARGE:  12/02/2010                              DISCHARGE SUMMARY   ADMISSION DIAGNOSIS:  Respiratory distress.  DISCHARGE DIAGNOSES: 1. Aspiration pneumonia. 2. Hypertension. 3. Alcohol abuse.  DISCHARGE CONDITION:  Good.  HOSPITAL COURSE:  George Schroeder presented to the ED of [facility] with a 1-day history of fever after heavy alcohol consumption.  He was found to be febrile and mild respiratory distress.  A chest x-ray was negative for pneumonia on September 16.  However, on September 17, a chest x-ray demonstrated left lower lobe opacity.  The patient was started on Rocephin and azithromycin for community-acquired pneumonia. However, the patient continued to be febrile to 103 degrees; therefore, clindamycin was started for suspected aspiration pneumonia.  The patient was also hypertensive throughout his hospitalization despite taking metoprolol 50 mg b.i.d., therefore, HCTZ was added to his regimen.  CONSULTATIONS:  Social work and case Insurance account manager.  SIGNIFICANT DIAGNOSTIC STUDIES:  Microbiology:  Blood cultures showed no growth today, but will be finalized on December 04, 2010. Radiology:  Chest x-ray showed infiltrates of the left lower lobe on September 17.  HOSPITAL TREATMENT:  Antibiotics:  Rocephin 1 gram IV x4 days, azithromycin 500 mg b.i.d. x4 days, and clindamycin 600 mg IV b.i.d. x3 days.  DISCHARGE MEDICATIONS: 1. Ciprofloxacin 500 mg by mouth twice daily for 7 days. 2. Clindamycin 300 mg take 2 tablets three times daily for 7 days. 3. Metoprolol 25 mg take 1 tablet by mouth twice daily. 4. Hydrochlorothiazide 25 mg 1 tablet by mouth daily. 5. Viagra 50 mg 1 tablet by mouth daily as  needed.  PHYSICAL EXAMINATION:  At the time of discharge; temperature is 98.3, blood pressure 130/91, respiratory rate 18, oxygen saturation 96% on room air, pulse was 82.  DISPOSITION:  The patient was discharged to home.  He will follow-up with [facility] network for application for government resources.  Additionally, he has a follow-up appointment with his primary care physician, Dr. Clementeen Graham at the [facility] on December 14, 2010.    ______________________________ Mat Carne, MD   ______________________________ Santiago Bumpers. Leveda Anna, M.D.    EW/MEDQ  D:  12/03/2010  T:  12/03/2010  Job:  [id number]  Electronically Signed by Mat Carne  on 01/07/2011 09:43:52 PM Electronically Signed by Doralee Albino M.D. on 01/13/2011 10:10:56 AM
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
## 2010-12-14 ENCOUNTER — Encounter: Payer: Self-pay | Admitting: Family Medicine

## 2010-12-14 ENCOUNTER — Ambulatory Visit (INDEPENDENT_AMBULATORY_CARE_PROVIDER_SITE_OTHER): Payer: Self-pay | Admitting: Family Medicine

## 2010-12-14 VITALS — BP 136/96 | HR 90 | Temp 98.3°F | Wt 160.0 lb

## 2010-12-14 DIAGNOSIS — J690 Pneumonitis due to inhalation of food and vomit: Secondary | ICD-10-CM | POA: Insufficient documentation

## 2010-12-14 DIAGNOSIS — I10 Essential (primary) hypertension: Secondary | ICD-10-CM

## 2010-12-14 DIAGNOSIS — F1010 Alcohol abuse, uncomplicated: Secondary | ICD-10-CM | POA: Insufficient documentation

## 2010-12-14 DIAGNOSIS — F819 Developmental disorder of scholastic skills, unspecified: Secondary | ICD-10-CM

## 2010-12-14 MED ORDER — HYDROCHLOROTHIAZIDE 25 MG PO TABS: 25.0000 mg | ORAL_TABLET | Freq: Every day | ORAL | Status: DC

## 2010-12-14 MED ORDER — METOPROLOL TARTRATE 25 MG PO TABS: 25.0000 mg | ORAL_TABLET | Freq: Two times a day (BID) | ORAL | Status: DC

## 2010-12-14 NOTE — Assessment & Plan Note (Signed)
Family and I am concerned. Objectively he has had trouble holding down a job.  This apparently has been lifelong.  Plan to refer to [facility] psychology clinic for further evaluation.

## 2010-12-14 NOTE — Progress Notes (Signed)
Mr. Saunders presents to clinic today to followup his pneumonia. He was discharged September 20 with aspiration pneumonia. He was treated with ceftriaxone and azithromycin in the hospital.  Prior to discharge she was switched to clindamycin and ciprofloxacin. He completed a course. He denies any fevers or chills and breathes normally.    Additionally during this hospitalization he was noted to be hypertensive. Hydrochlorothiazide 25 was added to his existing metoprolol 25.  Alcohol is put to be a contributing factor to his aspiration. He drinks one to 2 drinks a day sometimes they are 40 ounce malt liquor.  He denies any feeling like he should cut back however he does get in noted when people suggest that sometimes he feels guilty about his drinking and he denies any morning alcohol.  CAGE 2/4.    His sister accompanies him to clinic today she expresses concern that Mr. Bottger has not been able to hold a job down and has been homeless. The entire family is concerned that he may have undiagnosed mental retardation. Mr. Lauderback and his sister were both appreciate further testing.    PMH reviewed.  ROS as above otherwise neg Medications reviewed.  Exam:  BP 136/96  Pulse 90  Temp(Src) 98.3 F (36.8 C) (Oral)  Wt 160 lb (72.576 kg),  blood pressure rechecked. Gen: Well NAD HEENT: EOMI,  MMM, underbite Lungs: CTABL Nl WOB Heart: RRR no MRG Abd: NABS, NT, ND Exts: Non edematous BL  LE, warm and well perfused.

## 2010-12-14 NOTE — Assessment & Plan Note (Signed)
Currently doing well has completed drug therapy. We will follow. We will also encourage alcohol abstinence.

## 2010-12-14 NOTE — Patient Instructions (Signed)
Thank you for coming in today. Come back in 1 month.  Cut down to 1 drink a day. Start thinking about no drinks a day.

## 2010-12-14 NOTE — Assessment & Plan Note (Signed)
George Schroeder reaches criteria for alcohol abuse, as this uses excessive and has caused harm.  He isn't quite ready to quit at this time. I encouraged reduction in total alcohol intake and consideration of alcoholic anonymous.

## 2010-12-14 NOTE — Assessment & Plan Note (Signed)
Blood pressure currently elevated on hydrochlorothiazide and metoprolol. Mild elevation. Plan to recheck blood pressure in one month at followup visit likely at that point I will have lisinopril if still elevated. Creatinine checked in hospital found to be normal.

## 2011-01-12 ENCOUNTER — Ambulatory Visit (INDEPENDENT_AMBULATORY_CARE_PROVIDER_SITE_OTHER): Payer: Self-pay | Admitting: Family Medicine

## 2011-01-12 ENCOUNTER — Encounter: Payer: Self-pay | Admitting: Family Medicine

## 2011-01-12 VITALS — BP 118/88 | HR 108 | Temp 97.6°F | Ht 68.25 in | Wt 164.0 lb

## 2011-01-12 DIAGNOSIS — F819 Developmental disorder of scholastic skills, unspecified: Secondary | ICD-10-CM

## 2011-01-12 DIAGNOSIS — I10 Essential (primary) hypertension: Secondary | ICD-10-CM

## 2011-01-12 DIAGNOSIS — R0781 Pleurodynia: Secondary | ICD-10-CM

## 2011-01-12 DIAGNOSIS — K5900 Constipation, unspecified: Secondary | ICD-10-CM | POA: Insufficient documentation

## 2011-01-12 DIAGNOSIS — F1010 Alcohol abuse, uncomplicated: Secondary | ICD-10-CM

## 2011-01-12 DIAGNOSIS — R079 Chest pain, unspecified: Secondary | ICD-10-CM

## 2011-01-12 MED ORDER — DOCUSATE SODIUM 100 MG PO CAPS: 100.0000 mg | ORAL_CAPSULE | Freq: Two times a day (BID) | ORAL | Status: DC

## 2011-01-12 NOTE — Assessment & Plan Note (Addendum)
I am not sure why. he has been unable to get an appointment. I suspect because he does not have insurance. On asking Dr. Pascal Lux in clinical psychology and social worker for advice on this issue. We'll followup at the next visit.  Will need to call [phone] to get patient an appointment.

## 2011-01-12 NOTE — Assessment & Plan Note (Signed)
Doing much better. Continue to encourage reduction in alcohol intake.  Plan for one 12 ounce beer a day

## 2011-01-12 NOTE — Patient Instructions (Signed)
Thank you for coming in today. I will investigate what happened with the testing.  I will have my social worker contact your for some resources in the area.  Continue to cut back on alcohol. Maybe drink only 1 12 oz beer a day. See me in 2 months.

## 2011-01-12 NOTE — Assessment & Plan Note (Signed)
I suspect his pain is due to you mild costochondritis versus pleuritis. Advised that if it comes again or is worse or associated with palpitations or exertion to let me know

## 2011-01-12 NOTE — Assessment & Plan Note (Signed)
Blood pressure is well controlled with current regimen. Plan followup in one to 2 months

## 2011-01-12 NOTE — Assessment & Plan Note (Signed)
Mild constipation. Plan start Colace and follow

## 2011-01-12 NOTE — Progress Notes (Signed)
Mr. boylen presents to clinic to followup his blood pressure, psychology testing, alcohol use, and address new rib pain.  1) hypertension: Currently taking hydrochlorothiazide 25 daily and metoprolol 25 twice a day. Feels well no central chest pain, dyspnea, palpitations, syncope, edema. Feels well.  2) psychology: Both myself and his family suspects that Mr. kuhl may have undiagnosed mild mental retardation.  Referral to [facility] psychology clinic was made last visit. However he's been unable to get an appointment today. He has the orange card but no real insurance.  3) alcohol use: Has cut back history [place] to 140 ounce MALT liquor per day. He feels well. No withdrawal symptoms.  4) rib pain: Noted last night one minute of sharp left lower rib pain.  Not associated with eating or exertion. Deep breaths made the pain worse. Pain is not occurred since yesterday.  Feels well otherwise.  5) constipation: Has stools every other day. Sometimes feel like his abdomen is full. No blood in the stool.  PMH reviewed.  ROS as above otherwise neg Medications reviewed.  Exam:  BP 118/88  Pulse 108  Temp(Src) 97.6 F (36.4 C) (Oral)  Ht 5' 8.25" (1.734 m)  Wt 164 lb (74.39 kg)  BMI 24.75 kg/m2 Gen: Well NAD HEENT: EOMI,  MMM Lungs: CTABL Nl WOB Heart: RRR no MRG.  Mild tenderness to palpation over her left front lower ribs.  Abd: NABS, NT, ND Exts: Non edematous BL  LE, warm and well perfused.

## 2011-01-14 ENCOUNTER — Encounter: Payer: Self-pay | Admitting: Clinical

## 2011-01-14 ENCOUNTER — Ambulatory Visit: Payer: Self-pay | Admitting: Family Medicine

## 2011-01-14 NOTE — Progress Notes (Signed)
Social Work received referral to assist patient with resources for MR. Social Work observed in MD note that patient has been unable to make an appointment with the [facility] psychology center so social worker contacted the psychology center and was informed that they have appointments available. Social Worker contacted patient and informed patient of information and provided him with the number to schedule the appointment. Social Work will follow up with patient.

## 2011-01-31 ENCOUNTER — Telehealth: Payer: Self-pay | Admitting: Family Medicine

## 2011-01-31 NOTE — Telephone Encounter (Signed)
Missed his appointment with [facility] and he needs it rescheduled.

## 2011-01-31 NOTE — Telephone Encounter (Signed)
Pt states [facility] has a ? About his assessment. He will have them call us so we can ck w/ Dr Denyse Amass

## 2011-04-08 ENCOUNTER — Encounter: Payer: Self-pay | Admitting: Family Medicine

## 2011-04-13 ENCOUNTER — Ambulatory Visit (INDEPENDENT_AMBULATORY_CARE_PROVIDER_SITE_OTHER): Payer: Self-pay | Admitting: Family Medicine

## 2011-04-13 ENCOUNTER — Encounter: Payer: Self-pay | Admitting: Family Medicine

## 2011-04-13 VITALS — BP 145/88 | HR 72 | Ht 68.0 in | Wt 171.0 lb

## 2011-04-13 DIAGNOSIS — Z0000 Encounter for general adult medical examination without abnormal findings: Secondary | ICD-10-CM

## 2011-04-13 DIAGNOSIS — I10 Essential (primary) hypertension: Secondary | ICD-10-CM

## 2011-04-13 MED ORDER — LISINOPRIL-HYDROCHLOROTHIAZIDE 10-12.5 MG PO TABS: 1.0000 | ORAL_TABLET | Freq: Every day | ORAL | Status: DC

## 2011-04-13 MED ORDER — SILDENAFIL CITRATE 50 MG PO TABS: 50.0000 mg | ORAL_TABLET | ORAL | Status: DC

## 2011-04-13 NOTE — Patient Instructions (Signed)
Thank you for coming in today. Please change your blood pressure medicine to Lisinopril/Hydrochlorathiazide. You are on the waiting list for psychological testing at [facility]. We will follow this up in 2 months.  Come back Next week in the morning after only drinking water for labs.  See me again in 2 months.

## 2011-04-14 ENCOUNTER — Encounter: Payer: Self-pay | Admitting: Family Medicine

## 2011-04-14 DIAGNOSIS — Z0000 Encounter for general adult medical examination without abnormal findings: Secondary | ICD-10-CM | POA: Insufficient documentation

## 2011-04-14 NOTE — Progress Notes (Signed)
George Schroeder is a 46 y.o. male who presents to [facility] today for Well Visit.  In the interim [name] has done well. He notes some mild right knee pain a few weeks ago that has resolved.  Additionally he notes that he is only taking HCTZ and never started Metoporol.  He states that he is still on the waiting list at [facility] for IQ testing.  Additionally when asked about his alcohol use he notes that he drinks about 1 40 oz and 1 24 oz beer a day. He is happy with this amount of consumption and denies any CAGE questions.    PMH reviewed.  ROS as above otherwise neg Medications reviewed. Current Outpatient Prescriptions  Medication Sig Dispense Refill  . docusate sodium (COLACE) 100 MG capsule Take 1 capsule (100 mg total) by mouth 2 (two) times daily.  60 capsule  12  . lisinopril-hydrochlorothiazide (PRINZIDE,ZESTORETIC) 10-12.5 MG per tablet Take 1 tablet by mouth daily.  90 tablet  3  . sildenafil (VIAGRA) 50 MG tablet Take 1 tablet (50 mg total) by mouth as directed.  10 tablet  1    Exam:  BP 145/88  Pulse 72  Ht 5\' 8"  (1.727 m)  Wt 171 lb (77.565 kg)  BMI 26.00 kg/m2 Gen: Well NAD HEENT: EOMI,  MMM Lungs: CTABL Nl WOB Heart: RRR no MRG Abd: NABS, NT, ND Exts: Non edematous BL  LE, warm and well perfused.  MSK: RIght knee normal ROM, neg lachmans, valgus, Varus, and mcmurry tests.

## 2011-04-14 NOTE — Assessment & Plan Note (Signed)
Several issues identified today. 1) HTN: BP elevated on HCTZ alone. Will add ACE/HCTZ and follow up in a few months.  2) Alcohol Use: Encouraged George Schroeder to cut down on alcohol use.  3) Question of mild George: Still on waiting list at [facility] I called and confirmed this.  4) HLD?: Will check fasting lipids.   Screening: Not due for anything until he turns 48

## 2011-04-19 ENCOUNTER — Other Ambulatory Visit: Payer: Self-pay

## 2011-04-26 ENCOUNTER — Encounter: Payer: Self-pay | Admitting: Family Medicine

## 2011-04-26 ENCOUNTER — Other Ambulatory Visit: Payer: Self-pay

## 2011-04-26 ENCOUNTER — Ambulatory Visit (INDEPENDENT_AMBULATORY_CARE_PROVIDER_SITE_OTHER): Payer: Self-pay | Admitting: Family Medicine

## 2011-04-26 DIAGNOSIS — I10 Essential (primary) hypertension: Secondary | ICD-10-CM

## 2011-04-26 DIAGNOSIS — S46211A Strain of muscle, fascia and tendon of other parts of biceps, right arm, initial encounter: Secondary | ICD-10-CM | POA: Insufficient documentation

## 2011-04-26 DIAGNOSIS — S46819A Strain of other muscles, fascia and tendons at shoulder and upper arm level, unspecified arm, initial encounter: Secondary | ICD-10-CM

## 2011-04-26 DIAGNOSIS — S43499A Other sprain of unspecified shoulder joint, initial encounter: Secondary | ICD-10-CM

## 2011-04-26 LAB — LIPID PANEL
Cholesterol: 252 mg/dL — ABNORMAL HIGH (ref 0–200)
HDL: 80 mg/dL (ref >39)
LDL Cholesterol: 158 mg/dL — ABNORMAL HIGH (ref 0–99)
Total CHOL/HDL Ratio: 3.2 Ratio
Triglycerides: 71 mg/dL (ref <150)
VLDL: 14 mg/dL (ref 0–40)

## 2011-04-26 MED ORDER — CYCLOBENZAPRINE HCL 10 MG PO TABS: 10.0000 mg | ORAL_TABLET | Freq: Every evening | ORAL | Status: AC | PRN

## 2011-04-26 MED ORDER — LISINOPRIL-HYDROCHLOROTHIAZIDE 20-12.5 MG PO TABS: 1.0000 | ORAL_TABLET | Freq: Every day | ORAL | Status: DC

## 2011-04-26 MED ORDER — MELOXICAM 15 MG PO TABS: 15.0000 mg | ORAL_TABLET | Freq: Every day | ORAL | Status: DC

## 2011-04-26 NOTE — Assessment & Plan Note (Signed)
Blood pressure continues to be not adequately controlled. Plan to increase to 20 mg of lisinopril with 12.5 mg of hydrochlorothiazide. Followup in one month sooner if needed.

## 2011-04-26 NOTE — Patient Instructions (Signed)
Thank you for coming in today. I think you pulled a muscle in your arm.  It should get better.  Take meloxicam daily for up to 2 weeks as needed for pain.  Use cyclobenzaprine at night for muscle relaxer.  Put you old blood pressure pills away.  Start taking the new pills. They are the same medicine but more. The new pill is Lisinopril/HCTZ 20-12.5. It is a little stronger.  Light duty for 3 days.  See me again 1 month.

## 2011-04-26 NOTE — Progress Notes (Signed)
George Schroeder is a 46 y.o. male who presents to [facility] today for right biceps pain. Last Thursday or Wednesday at work he noted right muscle pain following lifting a large box. The pain worsened at home that evening. He has tried ointment on his muscle which hasn't helped much. He has continued to work through the pain but does not he continues to be bothersome. He denies any hand numbness or pain and feels well otherwise.  Blood pressure: Is taking lisinopril/hydrochlorothiazide 10-12.5 mg daily. No chest pains palpitations dyspnea or syncope   PMH reviewed. Significant for hypertension and question mild intellectual disability ROS as above otherwise neg Medications reviewed. Current Outpatient Prescriptions  Medication Sig Dispense Refill  . cyclobenzaprine (FLEXERIL) 10 MG tablet Take 1 tablet (10 mg total) by mouth at bedtime as needed (pain).  14 tablet  0  . docusate sodium (COLACE) 100 MG capsule Take 1 capsule (100 mg total) by mouth 2 (two) times daily.  60 capsule  12  . lisinopril-hydrochlorothiazide (PRINZIDE) 20-12.5 MG per tablet Take 1 tablet by mouth daily.  90 tablet  0  . meloxicam (MOBIC) 15 MG tablet Take 1 tablet (15 mg total) by mouth daily.  14 tablet  0  . sildenafil (VIAGRA) 50 MG tablet Take 1 tablet (50 mg total) by mouth as directed.  10 tablet  1  . DISCONTD: lisinopril-hydrochlorothiazide (PRINZIDE,ZESTORETIC) 10-12.5 MG per tablet Take 1 tablet by mouth daily.  90 tablet  3    Exam:  BP 162/93  Pulse 79  Ht 5\' 8"  (1.727 m)  Wt 168 lb (76.204 kg)  BMI 25.54 kg/m2 Gen: Well NAD HEENT: EOMI,  MMM Lungs: CTABL Nl WOB Heart: RRR no MRG Abd: NABS, NT, ND Exts: Non edematous BL  LE, warm and well perfused.  MSK: Right shoulder normal range of motion. Negative Hawkins and Neer's tests. Pain on palpation of the biceps muscle belly and on resisted elbow flexion.  Strength is preserved. Hand is neurovascularly intact

## 2011-04-26 NOTE — Assessment & Plan Note (Signed)
Injury to the muscle belly of the biceps itself at work.  Plan for light duty at work for 3 days meloxicam and Flexeril. If no improvement return to clinic. Patient expresses understanding

## 2011-04-26 NOTE — Progress Notes (Signed)
Lipid done today Linh Johannes 

## 2011-04-28 ENCOUNTER — Telehealth: Payer: Self-pay | Admitting: Family Medicine

## 2011-04-28 NOTE — Telephone Encounter (Signed)
Need note to go back to work.  Call when ready

## 2011-04-29 ENCOUNTER — Encounter: Payer: Self-pay | Admitting: Family Medicine

## 2011-04-29 NOTE — Telephone Encounter (Signed)
Letter written and routed to administrative team

## 2011-05-24 ENCOUNTER — Telehealth: Payer: Self-pay | Admitting: Family Medicine

## 2011-05-24 NOTE — Telephone Encounter (Signed)
Patient calling because [facility] on [address], did not receive the Rx for Lisinopril - HCTZ back in February and they need it resent.

## 2011-05-24 NOTE — Telephone Encounter (Signed)
Called pharmacy and was told that they did receive Lisinopril -HCTZ RX in Feb and patient has picked it up. Called patient and he states the  doctor had told him at last appointment that he was going to send in RX for cholesterol medicine. Patient coming in this week  for visit with PCP and will discuss with MD then.

## 2011-05-26 ENCOUNTER — Ambulatory Visit: Payer: Self-pay | Admitting: Family Medicine

## 2011-06-08 ENCOUNTER — Ambulatory Visit: Payer: Self-pay | Admitting: Family Medicine

## 2011-07-06 ENCOUNTER — Ambulatory Visit: Payer: Self-pay | Admitting: Family Medicine

## 2011-07-08 ENCOUNTER — Ambulatory Visit: Payer: Self-pay | Admitting: Family Medicine

## 2011-07-14 ENCOUNTER — Ambulatory Visit (INDEPENDENT_AMBULATORY_CARE_PROVIDER_SITE_OTHER): Payer: Self-pay | Admitting: Family Medicine

## 2011-07-14 ENCOUNTER — Encounter: Payer: Self-pay | Admitting: Family Medicine

## 2011-07-14 DIAGNOSIS — E785 Hyperlipidemia, unspecified: Secondary | ICD-10-CM
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
## 2011-07-27 ENCOUNTER — Ambulatory Visit: Payer: Self-pay | Admitting: Family Medicine

## 2011-08-12 ENCOUNTER — Ambulatory Visit (INDEPENDENT_AMBULATORY_CARE_PROVIDER_SITE_OTHER): Payer: Self-pay | Admitting: Family Medicine

## 2011-08-12 ENCOUNTER — Encounter: Payer: Self-pay | Admitting: Family Medicine

## 2011-08-12 VITALS — BP 120/62 | HR 78 | Temp 98.6°F | Ht 68.0 in | Wt 162.0 lb

## 2011-08-12 DIAGNOSIS — E785 Hyperlipidemia, unspecified: Secondary | ICD-10-CM

## 2011-08-12 DIAGNOSIS — F819 Developmental disorder of scholastic skills, unspecified: Secondary | ICD-10-CM

## 2011-08-12 DIAGNOSIS — I10 Essential (primary) hypertension: Secondary | ICD-10-CM

## 2011-08-12 DIAGNOSIS — F528 Other sexual dysfunction not due to a substance or known physiological condition: Secondary | ICD-10-CM

## 2011-08-12 DIAGNOSIS — R079 Chest pain, unspecified: Secondary | ICD-10-CM | POA: Insufficient documentation

## 2011-08-12 MED ORDER — SILDENAFIL CITRATE 50 MG PO TABS: 50.0000 mg | ORAL_TABLET | ORAL | Status: DC

## 2011-08-12 MED ORDER — ATORVASTATIN CALCIUM 40 MG PO TABS: 40.0000 mg | ORAL_TABLET | Freq: Every day | ORAL | Status: DC

## 2011-08-12 NOTE — Assessment & Plan Note (Signed)
LDL at 158. He will likely have some aspect of coronary artery disease with an LDL goal of less than 100. Regardless his current LDL goal is less than 130. Plan to start atorvastatin 40 mg at night and followup in one month  

## 2011-08-12 NOTE — Assessment & Plan Note (Signed)
Currently well controlled. Plan to continue hydrochlorothiazide/lisinopril

## 2011-08-12 NOTE — Patient Instructions (Signed)
Thank you for coming in today. For your cholesterol please start taking atorvistatin at night once a day.  Please come back in about 1 month for a check up.  For your shortness of breath we are going to get your heart checked out with a stress test.  Please await a phone call about your cardiology appointment. This is very important.  Call or go to the emergency room if you get worse, have trouble breathing, have chest pains, or palpitations.  Continue your blood pressure medicine.

## 2011-08-12 NOTE — Progress Notes (Signed)
George Schroeder is a 46 y.o. male who presents to [facility] today for   1) shortness of breath: Patient notes several months to years of gradual shortness of breath on exertion. He describes some vague shortness or breath when walking up a hill or pushing heavy object. This is associated with nonradiating central chest pressure. He denies any palpitations. He denies any other chest pain or shortness of breath and exertion. He denies any cough or wheezing.    2) hyperlipidemia: Patient was diagnosed with hyperlipidemia.  He is not on any medications. Please see above.  3) hypertension: Patient is taking hydrochlorothiazide/lisinopril. He is doing well without any issues other than noted above.  No syncope.  4) skin tag: Patient notes a lesion on his left posterior shoulder.  It is sometimes bothersome and can get irritated when he rubs it.  No bleeding. It is not changing.    PMH: Reviewed significant for hypertension former alcohol abuse and possible mental retardation History  Substance Use Topics  . Smoking status: Former Smoker -- 10 years  . Smokeless tobacco: Never Used  . Alcohol Use: Not on file   ROS as above  Medications reviewed. Current Outpatient Prescriptions  Medication Sig Dispense Refill  . lisinopril-hydrochlorothiazide (PRINZIDE) 20-12.5 MG per tablet Take 1 tablet by mouth daily.  90 tablet  0  . atorvastatin (LIPITOR) 40 MG tablet Take 1 tablet (40 mg total) by mouth at bedtime.  30 tablet  3  . docusate sodium (COLACE) 100 MG capsule Take 1 capsule (100 mg total) by mouth 2 (two) times daily.  60 capsule  12  . meloxicam (MOBIC) 15 MG tablet Take 1 tablet (15 mg total) by mouth daily.  14 tablet  0  . sildenafil (VIAGRA) 50 MG tablet Take 1 tablet (50 mg total) by mouth as directed.  10 tablet  1  . DISCONTD: sildenafil (VIAGRA) 50 MG tablet Take 1 tablet (50 mg total) by mouth as directed.  10 tablet  1    Exam:  BP 120/62  Pulse 78  Temp(Src) 98.6 F (37 C) (Oral)   Ht 5\' 8"  (1.727 m)  Wt 162 lb (73.483 kg)  BMI 24.63 kg/m2 Gen: Well NAD HEENT: EOMI,  MMM Lungs: CTABL Nl WOB Heart: RRR no MRG Abd: NABS, NT, ND Exts: Non edematous BL  LE, warm and well perfused.  Skin: Small skin tag on left posterior shoulder.    Labs reviewed: Lab Results  Component Value Date   CHOL 252* 04/26/2011   HDL 80 04/26/2011   LDLCALC 158* 04/26/2011   TRIG 71 04/26/2011   CHOLHDL 3.2 04/26/2011   Reviewed old EKG from February of 2013 showing flattened T waves in sinus tachycardia.

## 2011-08-12 NOTE — Assessment & Plan Note (Signed)
Patient has exertional shortness of breath with vague chest pain. He has a mildly abnormal EKG from February. Currently he is asymptomatic. I do believe that he would benefit from risk factor stratification with a stress test. He may be up to do an exercise stress test but he may also need a chemical stress test. Plan refer to [facility] cardiology for stress evaluation with followup with me in one month. Carefully reviewed signs or symptoms that would prompt return to health care. Please see discharge instructions.

## 2011-08-12 NOTE — Assessment & Plan Note (Signed)
LDL at 158. He will likely have some aspect of coronary artery disease with an LDL goal of less than 100. Regardless his current LDL goal is less than 130. Plan to start atorvastatin 40 mg at night and followup in one month

## 2011-09-02 ENCOUNTER — Ambulatory Visit: Payer: Self-pay | Admitting: Family Medicine

## 2011-09-07 ENCOUNTER — Ambulatory Visit: Payer: Self-pay | Admitting: Cardiology

## 2011-09-14 ENCOUNTER — Ambulatory Visit: Payer: Self-pay | Admitting: Family Medicine

## 2011-09-21 ENCOUNTER — Other Ambulatory Visit: Payer: Self-pay | Admitting: *Deleted

## 2011-09-21 DIAGNOSIS — I10 Essential (primary) hypertension: Secondary | ICD-10-CM

## 2011-09-21 MED ORDER — LISINOPRIL-HYDROCHLOROTHIAZIDE 20-12.5 MG PO TABS: 1.0000 | ORAL_TABLET | Freq: Every day | ORAL | Status: DC

## 2011-10-13 ENCOUNTER — Encounter: Payer: Self-pay | Admitting: Cardiology

## 2011-10-13 ENCOUNTER — Ambulatory Visit (INDEPENDENT_AMBULATORY_CARE_PROVIDER_SITE_OTHER): Payer: Self-pay | Admitting: Cardiology

## 2011-10-13 VITALS — BP 108/68 | HR 79 | Ht 68.0 in | Wt 155.0 lb

## 2011-10-13 DIAGNOSIS — R079 Chest pain, unspecified: Secondary | ICD-10-CM

## 2011-10-13 DIAGNOSIS — R9431 Abnormal electrocardiogram [ECG] [EKG]: Secondary | ICD-10-CM

## 2011-10-13 DIAGNOSIS — R0600 Dyspnea, unspecified: Secondary | ICD-10-CM

## 2011-10-13 DIAGNOSIS — I10 Essential (primary) hypertension: Secondary | ICD-10-CM

## 2011-10-13 DIAGNOSIS — R0609 Other forms of dyspnea: Secondary | ICD-10-CM

## 2011-10-13 NOTE — Patient Instructions (Signed)
We will schedule you for a stress Echo to evaluate your heart function and circulation.

## 2011-10-13 NOTE — Assessment & Plan Note (Signed)
Patient has predominately dyspnea on exertion but does have some atypical chest pain. He has cardiac risk factors of hypertension and hyperlipidemia. His ECG is abnormal suggesting hypertensive heart disease. Cannot rule out ischemia. Patient does not have insurance. I think the most economical way to evaluate his symptoms would be to do a stress echo. This will allow [place] to evaluate for potential ischemia as well as assess in his LV function and making sure he doesn't have significant valvular disease. If this study is unremarkable I would recommend continued efforts at risk factor modification.

## 2011-10-13 NOTE — Progress Notes (Signed)
George Schroeder Date of Birth: March 20, 1965 Medical Record #[id number]  History of Present Illness: George Schroeder is seen at the request of Dr. Denyse Amass for evaluation of dyspnea. He is a 46 year old black male with history of hypertension and hyperlipidemia. He is a former smoker. He reports a several month history of worsening shortness of breath on exertion. It is worse when he is walking up an incline or upstairs. He also has experienced some discomfort in his left chest in the midaxillary line. 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 He has no prior cardiac history. He has had no previous cardiac evaluation.  Current Outpatient Prescriptions on File Prior to Visit  Medication Sig Dispense Refill  . atorvastatin (LIPITOR) 40 MG tablet Take 1 tablet (40 mg total) by mouth at bedtime.  30 tablet  3  . lisinopril-hydrochlorothiazide (PRINZIDE) 20-12.5 MG per tablet Take 1 tablet by mouth daily.  90 tablet  0  . meloxicam (MOBIC) 15 MG tablet Take 1 tablet (15 mg total) by mouth daily.  14 tablet  0  . sildenafil (VIAGRA) 50 MG tablet Take 1 tablet (50 mg total) by mouth as directed.  10 tablet  1  . DISCONTD: docusate sodium (COLACE) 100 MG capsule Take 1 capsule (100 mg total) by mouth 2 (two) times daily.  60 capsule  12    No Known Allergies  Past Medical History  Diagnosis Date  . Hypertension   . Alcohol abuse   . Hyperlipidemia     Past Surgical History  Procedure Date  . Facial reconstruction surgery     History  Smoking status  . Former Smoker -- 10 years  Smokeless tobacco  . Never Used    History  Alcohol Use: Not on file    Family History  Problem Relation Age of Onset  . Hypertension Mother   . Alcohol abuse Father   . Heart disease Father   . Stroke Father   . Cancer Father     Review of Systems: As noted in history of present illness..  All other systems were reviewed and are negative.  Physical Exam: BP 108/68  Pulse 79  Ht 5\' 8"   (1.727 m)  Wt 70.308 kg (155 lb)  BMI 23.57 kg/m2  SpO2 98% He is a well-developed black male in no acute distress.The patient is alert and oriented x 3.  The mood and affect are normal.  The skin is warm and dry.  Color is normal.  The HEENT exam reveals that the sclera are nonicteric.  The mucous membranes are moist.  The carotids are 2+ without bruits.  There is no thyromegaly.  There is no JVD.  The lungs are clear.  The chest wall is non tender.  The heart exam reveals a regular rate with a normal S1 and S2.  There is a soft 1/6 systolic murmur at the apex. The PMI is not displaced.   Abdominal exam reveals good bowel sounds.  There is no guarding or rebound.  There is no hepatosplenomegaly or tenderness.  There are no masses.  Exam of the legs reveal no clubbing, cyanosis, or edema.  The legs are without rashes.  The distal pulses are intact.  Cranial nerves II - XII are intact.  Motor and sensory functions are intact.  The gait is normal.  LABORATORY DATA: ECG demonstrates normal sinus rhythm with moderate voltage criteria for LVH. There is T wave inversion in the inferior and lateral  leads.  Assessment / Plan:

## 2011-10-20 ENCOUNTER — Ambulatory Visit (INDEPENDENT_AMBULATORY_CARE_PROVIDER_SITE_OTHER): Payer: Self-pay | Admitting: Family Medicine

## 2011-10-20 ENCOUNTER — Encounter: Payer: Self-pay | Admitting: Family Medicine

## 2011-10-20 VITALS — BP 120/71 | HR 89 | Ht 60.0 in | Wt 156.8 lb

## 2011-10-20 DIAGNOSIS — K089 Disorder of teeth and supporting structures, unspecified: Secondary | ICD-10-CM

## 2011-10-20 DIAGNOSIS — M545 Low back pain, unspecified: Secondary | ICD-10-CM

## 2011-10-20 DIAGNOSIS — I10 Essential (primary) hypertension: Secondary | ICD-10-CM

## 2011-10-20 DIAGNOSIS — K0889 Other specified disorders of teeth and supporting structures: Secondary | ICD-10-CM

## 2011-10-20 DIAGNOSIS — F819 Developmental disorder of scholastic skills, unspecified: Secondary | ICD-10-CM

## 2011-10-20 DIAGNOSIS — E785 Hyperlipidemia, unspecified: Secondary | ICD-10-CM

## 2011-10-20 MED ORDER — TRAMADOL HCL 50 MG PO TABS: 50.0000 mg | ORAL_TABLET | Freq: Three times a day (TID) | ORAL | Status: DC | PRN

## 2011-10-20 MED ORDER — TRAMADOL HCL 50 MG PO TABS: 50.0000 mg | ORAL_TABLET | Freq: Three times a day (TID) | ORAL | Status: AC | PRN

## 2011-10-20 MED ORDER — IBUPROFEN 800 MG PO TABS: 800.0000 mg | ORAL_TABLET | Freq: Three times a day (TID) | ORAL | Status: AC | PRN

## 2011-10-20 MED ORDER — ATORVASTATIN CALCIUM 40 MG PO TABS: 40.0000 mg | ORAL_TABLET | Freq: Every day | ORAL | Status: DC

## 2011-10-20 MED ORDER — IBUPROFEN 800 MG PO TABS: 800.0000 mg | ORAL_TABLET | Freq: Three times a day (TID) | ORAL | Status: DC | PRN

## 2011-10-20 MED ORDER — PRAVASTATIN SODIUM 40 MG PO TABS: 40.0000 mg | ORAL_TABLET | Freq: Every day | ORAL | Status: DC

## 2011-10-20 MED ORDER — LISINOPRIL-HYDROCHLOROTHIAZIDE 20-12.5 MG PO TABS: 1.0000 | ORAL_TABLET | Freq: Every day | ORAL | Status: DC

## 2011-10-20 NOTE — Assessment & Plan Note (Addendum)
Prescribed atorvastatin 40mg  daily but [facility] doesn't carry it on formulary. Will switch to pravachol 40mg  and hope for response. If not adequate, will go back to lipitor.

## 2011-10-20 NOTE — Patient Instructions (Addendum)
Sciatica Sciatica is a weakness and/or changes in sensation (tingling, jolts, hot and cold, numbness) along the path the sciatic nerve travels. Irritation or damage to lumbar nerve roots is often also referred to as lumbar radiculopathy.  Lumbar radiculopathy (Sciatica) is the most common form of this problem. Radiculopathy can occur in any of the nerves coming out of the spinal cord. The problems caused depend on which nerves are involved. The sciatic nerve is the large nerve supplying the branches of nerves going from the hip to the toes. It often causes a numbness or weakness in the skin and/or muscles that the sciatic nerve serves. It also may cause symptoms (problems) of pain, burning, tingling, or electric shock-like feelings in the path of this nerve. This usually comes from injury to the fibers that make up the sciatic nerve. Some of these symptoms are low back pain and/or unpleasant feelings in the following areas:  From the mid-buttock down the back of the leg to the back of the knee.   And/or the outside of the calf and top of the foot.   And/or behind the inner ankle to the sole of the foot.  CAUSES   Herniated or slipped disc. Discs are the little cushions between the bones in the back.   Pressure by the piriformis muscle in the buttock on the sciatic nerve (Piriformis Syndrome).   Misalignment of the bones in the lower back and buttocks (Sacroiliac Joint Derangement).   Narrowing of the spinal canal that puts pressure on or pinches the fibers that make up the sciatic nerve.   A slipped vertebra that is out of line with those above or beneath it.   Abnormality of the nervous system itself so that nerve fibers do not transmit signals properly, especially to feet and calves (neuropathy).   Tumor (this is rare).  Your caregiver can usually determine the cause of your sciatica and begin the treatment most likely to help you. TREATMENT  Taking over-the-counter painkillers, physical  therapy, rest, exercise, spinal manipulation, and injections of anesthetics and/or steroids may be used. Surgery, acupuncture, and Yoga can also be effective. Mind over matter techniques, mental imagery, and changing factors such as your bed, chair, desk height, posture, and activities are other treatments that may be helpful. You and your caregiver can help determine what is best for you. With proper diagnosis, the cause of most sciatica can be identified and removed. Communication and cooperation between your caregiver and you is essential. If you are not successful immediately, do not be discouraged. With time, a proper treatment can be found that will make you comfortable. HOME CARE INSTRUCTIONS   If the pain is coming from a problem in the back, applying ice to that area for 15 to 20 minutes, 3 to 4 times per day while awake, may be helpful. Put the ice in a plastic bag. Place a towel between the bag of ice and your skin.   You may exercise or perform your usual activities if these do not aggravate your pain, or as suggested by your caregiver.   Only take over-the-counter or prescription medicines for pain, discomfort, or fever as directed by your caregiver.   If your caregiver has given you a follow-up appointment, it is very important to keep that appointment. Not keeping the appointment could result in a chronic or permanent injury, pain, and disability. If there is any problem keeping the appointment, you must call back to this facility for assistance.  SEEK IMMEDIATE MEDICAL CARE   IF:   You experience loss of control of bowel or bladder.   You have increasing weakness in the trunk, buttocks, or legs.   There is numbness in any areas from the hip down to the toes.   You have difficulty walking or keeping your balance.   You have any of the above, with fever or forceful vomiting.  Document Released: 02/22/2001 Document Revised: 02/17/2011 Document Reviewed: 10/12/2007 [facility] Patient  Information 2012 [place], [place].   Back Exercises Back exercises help treat and prevent back injuries. The goal of back exercises is to increase the strength of your abdominal and back muscles and the flexibility of your back. These exercises should be started when you no longer have back pain. Back exercises include:  Pelvic Tilt. Lie on your back with your knees bent. Tilt your pelvis until the lower part of your back is against the floor. Hold this position 5 to 10 sec and repeat 5 to 10 times.   Knee to Chest. Pull first 1 knee up against your chest and hold for 20 to 30 seconds, repeat this with the other knee, and then both knees. This may be done with the other leg straight or bent, whichever feels better.   Sit-Ups or Curl-Ups. Bend your knees 90 degrees. Start with tilting your pelvis, and do a partial, slow sit-up, lifting your trunk only 30 to 45 degrees off the floor. Take at least 2 to 3 seconds for each sit-up. Do not do sit-ups with your knees out straight. If partial sit-ups are difficult, simply do the above but with only tightening your abdominal muscles and holding it as directed.   Hip-Lift. Lie on your back with your knees flexed 90 degrees. Push down with your feet and shoulders as you raise your hips a couple inches off the floor; hold for 10 seconds, repeat 5 to 10 times.   Back arches. Lie on your stomach, propping yourself up on bent elbows. Slowly press on your hands, causing an arch in your low back. Repeat 3 to 5 times. Any initial stiffness and discomfort should lessen with repetition over time.   Shoulder-Lifts. Lie face down with arms beside your body. Keep hips and torso pressed to floor as you slowly lift your head and shoulders off the floor.  Do not overdo your exercises, especially in the beginning. Exercises may cause you some mild back discomfort which lasts for a few minutes; however, if the pain is more severe, or lasts for more than 15 minutes, do not  continue exercises until you see your caregiver. Improvement with exercise therapy for back problems is slow.  See your caregivers for assistance with developing a proper back exercise program. Document Released: 04/07/2004 Document Revised: 02/17/2011 Document Reviewed: 02/28/2005 [facility] Patient Information 2012 [place], [place].

## 2011-10-21 LAB — COMPLETE METABOLIC PANEL WITH GFR
ALT: 25 U/L (ref 0–53)
AST: 23 U/L (ref 0–37)
Albumin: 4.2 g/dL (ref 3.5–5.2)
Alkaline Phosphatase: 65 U/L (ref 39–117)
BUN: 16 mg/dL (ref 6–23)
CO2: 29 mEq/L (ref 19–32)
Calcium: 9.5 mg/dL (ref 8.4–10.5)
Chloride: 100 mEq/L (ref 96–112)
Creat: 0.98 mg/dL (ref 0.50–1.35)
GFR, Est African American: >89 mL/min
GFR, Est Non African American: >89 mL/min
Glucose, Bld: 80 mg/dL (ref 70–99)
Potassium: 4.1 mEq/L (ref 3.5–5.3)
Sodium: 138 mEq/L (ref 135–145)
Total Bilirubin: 0.3 mg/dL (ref 0.3–1.2)
Total Protein: 7.3 g/dL (ref 6.0–8.3)

## 2011-10-22 DIAGNOSIS — M545 Low back pain, unspecified: Secondary | ICD-10-CM | POA: Insufficient documentation

## 2011-10-22 DIAGNOSIS — K0889 Other specified disorders of teeth and supporting structures: Secondary | ICD-10-CM | POA: Insufficient documentation

## 2011-10-22 NOTE — Assessment & Plan Note (Signed)
Neuropsychology evaluation done. Recommendations included followup with neurology for evaluation of intellectual and memory impairment. Referral to [facility]. Other recommendations included enlisting family members to help him remember important information. Also recommended for him to carry voice recorder tear record notes to himself to remind him. Drinking behavior may also be having an impact on his cognitive difficulty. This will have to be further monitored.

## 2011-10-22 NOTE — Assessment & Plan Note (Signed)
Referral to dental clinic. Ibuprofen 800 mg 4 back pain will also help with tooth ache. No evidence of acute infection at this time, but will have low threshold for starting penicillin.
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
## 2011-10-22 NOTE — Assessment & Plan Note (Addendum)
Likely sciatic. No evidence of weakness in lower extremities Recommended that patient not to do too much heavy lifting although that may not always be an option given his work. Prescribed ibuprofen 800 mg 3 times daily. As well as tramadol 50 mg twice daily. Patient to followup if not improved. Red flags reviewed with patient.

## 2011-10-22 NOTE — Assessment & Plan Note (Addendum)
Controlled. Continue current management lisinopril hydrochlorothiazide daily. Obtain CMP for electrolytes and liver function

## 2011-10-24 ENCOUNTER — Encounter: Payer: Self-pay | Admitting: Cardiology

## 2011-10-24 ENCOUNTER — Ambulatory Visit (HOSPITAL_COMMUNITY): Payer: Self-pay | Attending: Cardiology

## 2011-10-24 DIAGNOSIS — F1011 Alcohol abuse, in remission: Secondary | ICD-10-CM | POA: Insufficient documentation

## 2011-10-24 DIAGNOSIS — R9431 Abnormal electrocardiogram [ECG] [EKG]: Secondary | ICD-10-CM

## 2011-10-24 DIAGNOSIS — Z8249 Family history of ischemic heart disease and other diseases of the circulatory system: Secondary | ICD-10-CM | POA: Insufficient documentation

## 2011-10-24 DIAGNOSIS — R079 Chest pain, unspecified: Secondary | ICD-10-CM

## 2011-10-24 DIAGNOSIS — E785 Hyperlipidemia, unspecified: Secondary | ICD-10-CM | POA: Insufficient documentation

## 2011-10-24 DIAGNOSIS — R42 Dizziness and giddiness: Secondary | ICD-10-CM | POA: Insufficient documentation

## 2011-10-24 DIAGNOSIS — R072 Precordial pain: Secondary | ICD-10-CM | POA: Insufficient documentation

## 2011-10-24 DIAGNOSIS — R0609 Other forms of dyspnea: Secondary | ICD-10-CM | POA: Insufficient documentation

## 2011-10-24 DIAGNOSIS — R0600 Dyspnea, unspecified: Secondary | ICD-10-CM

## 2011-10-24 DIAGNOSIS — R0989 Other specified symptoms and signs involving the circulatory and respiratory systems: Secondary | ICD-10-CM | POA: Insufficient documentation

## 2011-10-24 DIAGNOSIS — I10 Essential (primary) hypertension: Secondary | ICD-10-CM | POA: Insufficient documentation

## 2011-10-24 DIAGNOSIS — R5381 Other malaise: Secondary | ICD-10-CM | POA: Insufficient documentation

## 2011-10-24 DIAGNOSIS — Z87891 Personal history of nicotine dependence: Secondary | ICD-10-CM | POA: Insufficient documentation

## 2011-10-24 NOTE — Progress Notes (Signed)
Stress echocardiogram performed. 

## 2011-12-29 ENCOUNTER — Ambulatory Visit (INDEPENDENT_AMBULATORY_CARE_PROVIDER_SITE_OTHER): Payer: Self-pay | Admitting: Family Medicine

## 2011-12-29 ENCOUNTER — Other Ambulatory Visit: Payer: Self-pay | Admitting: Family Medicine

## 2011-12-29 ENCOUNTER — Encounter: Payer: Self-pay | Admitting: Family Medicine

## 2011-12-29 VITALS — BP 116/69 | HR 67 | Ht 68.0 in | Wt 153.0 lb

## 2011-12-29 DIAGNOSIS — E785 Hyperlipidemia, unspecified: Secondary | ICD-10-CM

## 2011-12-29 DIAGNOSIS — IMO0002 Reserved for concepts with insufficient information to code with codable children: Secondary | ICD-10-CM

## 2011-12-29 DIAGNOSIS — M79609 Pain in unspecified limb: Secondary | ICD-10-CM

## 2011-12-29 DIAGNOSIS — L608 Other nail disorders: Secondary | ICD-10-CM

## 2011-12-29 DIAGNOSIS — L602 Onychogryphosis: Secondary | ICD-10-CM

## 2011-12-29 DIAGNOSIS — I10 Essential (primary) hypertension: Secondary | ICD-10-CM

## 2011-12-29 MED ORDER — LISINOPRIL-HYDROCHLOROTHIAZIDE 20-12.5 MG PO TABS: 1.0000 | ORAL_TABLET | Freq: Every day | ORAL | Status: DC

## 2011-12-29 NOTE — Patient Instructions (Signed)
For the cholesterol, take the pravastatin one a day and in 6 weeks we'll check your cholesterol again. Call the office and make a lab appointment and come fasting, no food or drink other than water after midnight.   Send me the lab information from [facility].

## 2011-12-29 NOTE — Progress Notes (Signed)
Patient ID: DESHONE LYSSY, male   DOB: 02-20-1966, 46 y.o.   MRN: [id number] Patient ID: JEDADIAH ABDALLAH    DOB: 07/28/1965, 46 y.o.   MRN: [id number] --- Subjective:  Andyn is a 46 y.o.male who presents for follow up on hypertension. - HTN: denies chest pain, shortness of breath, lower extremity swelling. Has been taking his medications on a regular basis. Denies any light headedness.  - right big toenail: long and creating irritation when he puts sock on it. Toenails on foot are thick. No cut in skin. Has been present for several days. 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 He is scheduled to see a therapist at the end of the month. He continue to have rushing thoughts that make it difficult to focus and affect his short term memory.     ROS: see HPI Past Medical History: reviewed and updated medications and allergies. Social History: Tobacco: not currently  Objective: Filed Vitals:   12/29/11 1445  BP: 116/69  Pulse: 67    Physical Examination:   General appearance - alert, well appearing, and in no distress Chest - clear to auscultation, no wheezes, rales or rhonchi, symmetric air entry Heart - normal rate, regular rhythm, normal S1, S2, no murmurs, rubs, clicks or gallops Abdomen - soft, nontender, nondistended, no masses or organomegaly Extremities - right big toe: long and thick toenail, with looseness at the nail bed. No break into skin. Remaining toenails on right foot thickened.

## 2011-12-30 LAB — KOH PREP

## 2012-01-01 DIAGNOSIS — B351 Tinea unguium: Secondary | ICD-10-CM | POA: Insufficient documentation

## 2012-01-01 NOTE — Assessment & Plan Note (Addendum)
Patient admits to not taking pravastatin. Recommended that patient start taking pravastatin 40mg  daily. WIll then check fasting lipid panel in 6-8 weeks. Patient to come at that time for labs. Goal is less than 130

## 2012-01-01 NOTE — Assessment & Plan Note (Signed)
Toenails on right foot are thickened, making onychomycosis. Will send nails for culture. No evidence of ingrown toenail at this time. Big toenail is loose and may fall on its own. Trimmed toenails during office visit, to help relieve some of the discomfort. Will await for results of culture before treating any possible fungal infection.

## 2012-01-01 NOTE — Assessment & Plan Note (Signed)
Well controled with hctz/lisinopril 20/12.5 at 116/69. Continue current treatment.

## 2012-01-18 LAB — CULTURE, FUNGUS WITHOUT SMEAR

## 2012-02-06 ENCOUNTER — Ambulatory Visit (INDEPENDENT_AMBULATORY_CARE_PROVIDER_SITE_OTHER): Payer: Self-pay | Admitting: Family Medicine

## 2012-02-06 ENCOUNTER — Encounter: Payer: Self-pay | Admitting: Family Medicine

## 2012-02-06 ENCOUNTER — Other Ambulatory Visit: Payer: Self-pay

## 2012-02-06 VITALS — BP 150/86 | HR 61 | Temp 97.9°F | Ht 68.0 in | Wt 155.0 lb

## 2012-02-06 DIAGNOSIS — E785 Hyperlipidemia, unspecified: Secondary | ICD-10-CM

## 2012-02-06 DIAGNOSIS — I10 Essential (primary) hypertension: Secondary | ICD-10-CM

## 2012-02-06 DIAGNOSIS — B351 Tinea unguium: Secondary | ICD-10-CM

## 2012-02-06 LAB — COMPLETE METABOLIC PANEL WITH GFR
ALT: 29 U/L (ref 0–53)
AST: 25 U/L (ref 0–37)
Albumin: 4.5 g/dL (ref 3.5–5.2)
Alkaline Phosphatase: 63 U/L (ref 39–117)
BUN: 11 mg/dL (ref 6–23)
CO2: 32 mEq/L (ref 19–32)
Calcium: 9.9 mg/dL (ref 8.4–10.5)
Chloride: 102 mEq/L (ref 96–112)
Creat: 0.92 mg/dL (ref 0.50–1.35)
GFR, Est African American: >89 mL/min
GFR, Est Non African American: >89 mL/min
Glucose, Bld: 97 mg/dL (ref 70–99)
Potassium: 4.6 mEq/L (ref 3.5–5.3)
Sodium: 140 mEq/L (ref 135–145)
Total Bilirubin: 0.3 mg/dL (ref 0.3–1.2)
Total Protein: 6.8 g/dL (ref 6.0–8.3)

## 2012-02-06 NOTE — Assessment & Plan Note (Addendum)
Nail cultures from previous visit showing evidence of onychomycosis, confirmed by clinical exam. Will obtain LFT's today before starting terbinafine.  In the meantime, toenails were trimmed in the office

## 2012-02-06 NOTE — Assessment & Plan Note (Signed)
Elevated today. Most likely because patient had only been taking HCTZ 12.5mg  with the new formulation of medicine. Instructed patient to take both medications at once. Patient to return in 1 month for BP check.

## 2012-02-06 NOTE — Patient Instructions (Addendum)
For the blood pressure, you need to take: LISINOPRIL 20mg  daily: so if the LISINOPRIL that you have is 40mg , cut it in half, but if it's 20mg , take it normally.  HYDROCHLOROTHIAZIDE: 12.5mg . So, if when you get home, the HYDROCHLOROTHIAZIDE is 25mg , take half of the pill, but if it's 12.5mg , take the whole pill.   For the nail fungus, if your labs are normal, I'll start you on an oral medicine.   Also, make sure you take the pravastatin, for cholesterol, to protect your heart arteries. Once you start taking it, we will check your cholesterol in 2 months after you taking the first pill.   I'd like to see you back in 1 months to check on your blood pressure.

## 2012-02-06 NOTE — Progress Notes (Signed)
Patient ID: DAYMIAN LILL    DOB: 1965/10/24, 46 y.o.   MRN: [id number] --- Subjective:  Olan is a 46 y.o.male who presents with concern about toenails which have continued to be long and thick.   - toenails: noticed increasing toenail thickness in right foot more than left. This is associated with some discomfort. No skin break, no skin lesions associated with it. Has not been taking anything.  -HTN: has been taking HCTZ 12,5mg . [facility] and was told to take both lisinopril and HCTZ and to divide HCTZ. Since he used to only take one pill for his blood pressure, he wasn't sure of what medicine to take and took HCTZ which he has been dividing. He denies any chest pain, any shortness of breath, any lower extremity swelling.   Headache: reports some frontal headaches that last and go away on their own, not associated with change in vision, nausea or vomiting. He has been having them regularly for a few months. They have not changed in nature or intensity. 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 Social History: Tobacco: former smoker  Objective: Filed Vitals:   02/06/12 1025  BP: 150/86  Pulse: 61  Temp: 97.9 F (36.6 C)    Physical Examination:   General appearance - alert, well appearing, and in no distress Chest - clear to auscultation, no wheezes, rales or rhonchi, symmetric air entry Heart - normal rate, regular rhythm, normal S1, S2, no murmurs, rubs, clicks or gallops Extremities - peripheral pulses normal, no pedal edema Feet: long, thick toenails with fungal appearance, no evidence of ingrown toenails.

## 2012-02-06 NOTE — Assessment & Plan Note (Signed)
Has not been taking his statin. I explained the importance of taking it to protect his heart. Patient appeared to understand. Instructed patient to take medicine every day and that we would check level 2 months after he started taking it. Will not check lipid panel today since he has not been on statin therapy.

## 2012-02-07 ENCOUNTER — Telehealth: Payer: Self-pay | Admitting: Family Medicine

## 2012-02-07 ENCOUNTER — Encounter: Payer: Self-pay | Admitting: Family Medicine

## 2012-02-07 DIAGNOSIS — B351 Tinea unguium: Secondary | ICD-10-CM

## 2012-02-07 DIAGNOSIS — E785 Hyperlipidemia, unspecified: Secondary | ICD-10-CM

## 2012-02-07 MED ORDER — TERBINAFINE HCL 250 MG PO TABS: 250.0000 mg | ORAL_TABLET | Freq: Every day | ORAL | Status: DC

## 2012-02-07 NOTE — Telephone Encounter (Signed)
Called and left message stating that LFT's were normal and that I will start lamisil for his nail fungus. He needs to take it for at least 3months and will need to get lab tests for LFT monitoring in 6 weeks.

## 2012-03-05 ENCOUNTER — Other Ambulatory Visit: Payer: Self-pay | Admitting: *Deleted

## 2012-03-05 DIAGNOSIS — I10 Essential (primary) hypertension: Secondary | ICD-10-CM

## 2012-03-08 MED ORDER — LISINOPRIL-HYDROCHLOROTHIAZIDE 20-12.5 MG PO TABS: 1.0000 | ORAL_TABLET | Freq: Every day | ORAL | Status: DC

## 2012-03-12 ENCOUNTER — Ambulatory Visit (HOSPITAL_COMMUNITY)
Admission: RE | Admit: 2012-03-12 | Discharge: 2012-03-12 | Disposition: A | Discharge status: Home / Self Care | Payer: No Typology Code available for payment source | Source: Ambulatory Visit | Attending: Family Medicine | Admitting: Family Medicine

## 2012-03-12 ENCOUNTER — Ambulatory Visit (INDEPENDENT_AMBULATORY_CARE_PROVIDER_SITE_OTHER): Payer: No Typology Code available for payment source | Admitting: Family Medicine

## 2012-03-12 ENCOUNTER — Other Ambulatory Visit: Payer: Self-pay | Admitting: *Deleted

## 2012-03-12 ENCOUNTER — Encounter: Payer: Self-pay | Admitting: Family Medicine

## 2012-03-12 VITALS — BP 118/80 | HR 83 | Ht 68.0 in | Wt 150.0 lb

## 2012-03-12 DIAGNOSIS — I10 Essential (primary) hypertension: Secondary | ICD-10-CM

## 2012-03-12 DIAGNOSIS — R0789 Other chest pain: Secondary | ICD-10-CM

## 2012-03-12 DIAGNOSIS — M79672 Pain in left foot: Secondary | ICD-10-CM | POA: Insufficient documentation

## 2012-03-12 DIAGNOSIS — M79609 Pain in unspecified limb: Secondary | ICD-10-CM

## 2012-03-12 DIAGNOSIS — R079 Chest pain, unspecified: Secondary | ICD-10-CM | POA: Insufficient documentation

## 2012-03-12 DIAGNOSIS — B351 Tinea unguium: Secondary | ICD-10-CM

## 2012-03-12 MED ORDER — LISINOPRIL-HYDROCHLOROTHIAZIDE 20-12.5 MG PO TABS: 1.0000 | ORAL_TABLET | Freq: Every day | ORAL | Status: DC

## 2012-03-12 MED ORDER — TERBINAFINE HCL 250 MG PO TABS: 250.0000 mg | ORAL_TABLET | Freq: Every day | ORAL | Status: DC

## 2012-03-12 MED ORDER — IBUPROFEN 600 MG PO TABS: 600.0000 mg | ORAL_TABLET | Freq: Three times a day (TID) | ORAL | Status: DC | PRN

## 2012-03-12 NOTE — Assessment & Plan Note (Signed)
Given chronicity and work significant for heavy walking, will check foot xray to rule out stress fracture. iburofen 600mg  every 8 hours for pain.

## 2012-03-12 NOTE — Assessment & Plan Note (Signed)
Likely musculoskeletal. Has had normal cardiac workup (stress echo in Auguist 2013). EKG today doesn't show any arrhythmia or acute ST change.  Treat pain with ibuprofen

## 2012-03-12 NOTE — Patient Instructions (Addendum)
For the foot pain, you can take an ibuprofen 600mg  every 8 hours. If you cannot get the prescription ibuprofen, you can use 3 tablets of the over the counter ibuprofen (they are 200mg  each).   For the chest pain, I think it is from muscle pain. You can take ibuprofen for that as well as see if the pain is better.   If you start having shortness of breath with the chest pain, if the chest pain is like a pressure that doesn't go away and if it worst with exercise or walking, please come back to the clinic or the ER.

## 2012-03-12 NOTE — Assessment & Plan Note (Signed)
Well controled. Continue HCTZ/lisinopril 20/12.5

## 2012-03-12 NOTE — Progress Notes (Signed)
Patient ID: [name]    DOB: Jun 04, 1965, 46 y.o.   MRN: [id number] --- Subjective:  George Schroeder is a 46 y.o.male who presents for follow up on blood pressure as well as complaint of left foot pain.  - blood pressure: has been taking lisinopril/HCTZ. Denies any shortness of breath, but does report some chest discomfort this morning. Pain is sharp, lasts a few minutes, located under left rib, worst with movement of shoulder, not associated with SOb, nausea, vomiting or diaphoresis. Not pleuritic, not exertional. Had stress echo in 10/24/11 which did not show any acute change.  - left foot painL x1 month, located at front, middle bottom of foot, worst with walking. He is on his feet all day at work and works on [employer]. Wears tennis shoes at work. Doesn't recall trauma to area, has not used any medicine for it. Is able to move his foot. Pain used to be worst earlier on and now is improved.    ROS: see HPI Past Medical History: reviewed and updated medications and allergies. Social History: Tobacco: former smoker  Objective: Filed Vitals:   03/12/12 1043  BP: 118/80  Pulse: 83    Physical Examination:   General appearance - alert, well appearing, and in no distress Neck - supple, no significant adenopathy Chest - clear to auscultation, no wheezes, rales or rhonchi, symmetric air entry Heart - normal rate, regular rhythm, normal S1, S2, 1/6 systolic murmur, no chest tenderness Extremities -no pedal edema, mild tenderness to palpation along second and third metatarsal at bottom of foot, normal range of motion of foot and toes, normal dorsalis pedis pulse.

## 2012-03-12 NOTE — Assessment & Plan Note (Signed)
Has not filled lamysil yet. Gave Rx for lamisil. Check LFT's in 6 weeks

## 2012-04-04 ENCOUNTER — Encounter: Payer: Self-pay | Admitting: Family Medicine

## 2012-04-04 ENCOUNTER — Ambulatory Visit (INDEPENDENT_AMBULATORY_CARE_PROVIDER_SITE_OTHER): Payer: No Typology Code available for payment source | Admitting: Family Medicine

## 2012-04-04 VITALS — BP 132/81 | HR 82 | Ht 68.0 in | Wt 149.0 lb

## 2012-04-04 DIAGNOSIS — M545 Low back pain, unspecified: Secondary | ICD-10-CM

## 2012-04-04 DIAGNOSIS — E669 Obesity, unspecified: Secondary | ICD-10-CM

## 2012-04-04 DIAGNOSIS — Z23 Encounter for immunization: Secondary | ICD-10-CM

## 2012-04-04 DIAGNOSIS — F528 Other sexual dysfunction not due to a substance or known physiological condition: Secondary | ICD-10-CM

## 2012-04-04 LAB — POCT GLYCOSYLATED HEMOGLOBIN (HGB A1C): Hemoglobin A1C: 5.6

## 2012-04-04 MED ORDER — IBUPROFEN 600 MG PO TABS: 600.0000 mg | ORAL_TABLET | Freq: Three times a day (TID) | ORAL | Status: DC | PRN

## 2012-04-04 NOTE — Assessment & Plan Note (Signed)
Unclear presentation and whether an agent such as viagra or cialis would help. Will need to read more about this.  Checked A1C to rule out diabetes as possible cause. A1C was normal which correlates with normal glucose on fasting BMP.

## 2012-04-04 NOTE — Assessment & Plan Note (Signed)
Pain improving without medication for 2 weeks, which is reassuring. No weakness in lower extremities on exam, no red flags. Will prescibe ibuprofen 600mg  q8/prn. Reviewed red flags for return

## 2012-04-04 NOTE — Progress Notes (Signed)
Patient ID: George Schroeder    DOB: 1965/05/30, 47 y.o.   MRN: [id number] --- Subjective:  George Schroeder is a 47 y.o.male who presents with  - lower back: x2 weeks. Associated with lifting a heavy pallet at work.  Worst with laying down on the back. Pain radiated to side of left leg from the lower back. No weakness. No numbness. No urine or bowel incontinence. Not affecting ability to walk.  Improving but no completely resolved. Not taking anything for it.   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 This has created a problem with his sexual male partner and he is very concerned about it. He has had this for 1.5 years. He drinks one beer a day. Denies other drug use. Reports having a sex drive.     ROS: see HPI Past Medical History: reviewed and updated medications and allergies. Social History: Tobacco: former smoker  Objective: Filed Vitals:   04/04/12 1537  BP: 132/81  Pulse: 82    Physical Examination:   General appearance - alert, well appearing, and in no distress Back - tenderness to palpation along left para lumbar muscles, 5/5 strength bilaterally in lower extremities, normal sensation, negative straight leg.

## 2012-04-04 NOTE — Patient Instructions (Signed)
For the back pain, take the ibuprofen with food. You can get over the counter cream called aspercreme to apply on your back.   I'll let you know if the sugar test is abnormal. I'll be in touch with you.

## 2012-04-08 ENCOUNTER — Telehealth: Payer: Self-pay | Admitting: Family Medicine

## 2012-04-08 NOTE — Telephone Encounter (Signed)
Called patient to let him know that his A1C was normal and that starting a medicine like viagra or cialis for diffiuclty with erection was reasonable. Patient agreeable to this. Will send Rx for cialis tomorrow to the health department.   Marena Chancy, PGY-2 Family Medicine Resident

## 2012-04-09 ENCOUNTER — Telehealth: Payer: Self-pay | Admitting: Family Medicine

## 2012-04-09 MED ORDER — TADALAFIL 20 MG PO TABS: 10.0000 mg | ORAL_TABLET | ORAL | Status: DC | PRN

## 2012-04-09 NOTE — Telephone Encounter (Signed)
Rx printed for cialis. Will fax to health department.

## 2012-05-02 ENCOUNTER — Ambulatory Visit (INDEPENDENT_AMBULATORY_CARE_PROVIDER_SITE_OTHER): Payer: Self-pay | Admitting: Family Medicine

## 2012-05-02 ENCOUNTER — Encounter: Payer: Self-pay | Admitting: Family Medicine

## 2012-05-02 VITALS — BP 135/84 | HR 97 | Temp 98.8°F | Wt 145.0 lb

## 2012-05-02 DIAGNOSIS — F528 Other sexual dysfunction not due to a substance or known physiological condition: Secondary | ICD-10-CM

## 2012-05-02 DIAGNOSIS — Z125 Encounter for screening for malignant neoplasm of prostate: Secondary | ICD-10-CM | POA: Insufficient documentation

## 2012-05-02 DIAGNOSIS — Z139 Encounter for screening, unspecified: Secondary | ICD-10-CM

## 2012-05-02 DIAGNOSIS — M545 Low back pain, unspecified: Secondary | ICD-10-CM

## 2012-05-02 MED ORDER — TADALAFIL 20 MG PO TABS: 10.0000 mg | ORAL_TABLET | ORAL | Status: DC | PRN

## 2012-05-02 MED ORDER — TRAMADOL HCL 50 MG PO TABS: 50.0000 mg | ORAL_TABLET | Freq: Four times a day (QID) | ORAL | Status: DC | PRN

## 2012-05-02 MED ORDER — IBUPROFEN 600 MG PO TABS: 600.0000 mg | ORAL_TABLET | Freq: Three times a day (TID) | ORAL | Status: DC | PRN

## 2012-05-02 NOTE — Assessment & Plan Note (Signed)
Likely from sciatica given description of symptoms. No findings on exam warranting need for xray. Patient had not been taking any medication for pain. Treat with tramadol 50mg  q6/prn and ibuprofen 600mg  q6/prn.  Patient to return in 1 month. Red flags reviewed.

## 2012-05-02 NOTE — Patient Instructions (Addendum)
For the back pain, I think it is caused by sciatica. Please take tramadol 50mg  every 6 hours as needed for pain. You can alternate it with ibuprofen 600mg  every 6 hours as needed for pain.  If the pain gets worst with medicine, if you develop weakness or worsening numbness, if you have accidents of bowel or urine, please come to the clinic or the ED.   I'll see you back in 1 month or sooner if needed.

## 2012-05-02 NOTE — Progress Notes (Signed)
Patient ID: George Schroeder    DOB: 19-Nov-1965, 47 y.o.   MRN: [id number] --- Subjective:  George Schroeder is a 47 y.o.male who presents with pain in lower back. Pain shoots down from back to leg and left side of testicle.  Feeling of numbness in left leg. Worst in the last week. Overall has had back pain for 2-3 weeks. Worst in the morning when waking up. Laying down helps and moving is worst. No weakness.  Has no taken anything for it.   - wants prostate checked.  ROS: see HPI Past Medical History: reviewed and updated medications and allergies. Social History: Tobacco: former smoker  Objective: Filed Vitals:   05/02/12 1403  BP: 135/84  Pulse: 97  Temp: 98.8 F (37.1 C)    Physical Examination:   General appearance - alert, well appearing, and in no distress Back - no tenderness along lumbar spine, no paraspinal muscle tenderness, no tenderness along sciatic notch.  5/5 strength with knee flexion and extension bilaterally, 5/5 strength in dorsiflexion and plantarflexion bilaterally. Normal sensation to light touch in lower extr. B/l.  Rectal exam: normal tone, hard stool in vault, no prostate tenderness, no enlarged or hard prostate appreciated.

## 2012-05-02 NOTE — Assessment & Plan Note (Signed)
Rx for cialis. 

## 2012-05-02 NOTE — Assessment & Plan Note (Signed)
Patient concerned about prostate cancer. No family h/o prostate cancer. He asked for PSA. Prostate exam appears benign. Will check PSA at patient request.

## 2012-06-21 ENCOUNTER — Ambulatory Visit: Payer: Self-pay | Admitting: Family Medicine

## 2012-08-17 ENCOUNTER — Ambulatory Visit: Payer: Self-pay | Admitting: Family Medicine

## 2012-09-26 ENCOUNTER — Encounter: Payer: Self-pay | Admitting: Family Medicine

## 2012-09-26 ENCOUNTER — Ambulatory Visit (INDEPENDENT_AMBULATORY_CARE_PROVIDER_SITE_OTHER): Payer: Self-pay | Admitting: Family Medicine

## 2012-09-26 VITALS — BP 143/93 | HR 73 | Ht 68.0 in | Wt 151.0 lb

## 2012-09-26 DIAGNOSIS — I10 Essential (primary) hypertension: Secondary | ICD-10-CM

## 2012-09-26 DIAGNOSIS — M545 Low back pain, unspecified: Secondary | ICD-10-CM

## 2012-09-26 DIAGNOSIS — F819 Developmental disorder of scholastic skills, unspecified: Secondary | ICD-10-CM

## 2012-09-26 MED ORDER — PRAVASTATIN SODIUM 40 MG PO TABS: 40.0000 mg | ORAL_TABLET | Freq: Every day | ORAL | Status: DC

## 2012-09-26 MED ORDER — LISINOPRIL-HYDROCHLOROTHIAZIDE 20-12.5 MG PO TABS: 1.0000 | ORAL_TABLET | Freq: Every day | ORAL | Status: DC

## 2012-09-26 MED ORDER — TRAMADOL HCL 50 MG PO TABS
50.0000 mg | ORAL_TABLET | Freq: Three times a day (TID) | ORAL | Status: DC | PRN
Start: 2012-09-26 — End: 2012-10-26

## 2012-09-26 NOTE — Patient Instructions (Addendum)
For the back pain, I am writing a prescription for tramadol that you can take three times a day as needed for pain.  You can also take tylenol over the counter 325mg : take 2 tablets three times a day as needed for pain.  Alternate the tramadol and the tylenol as needed.   I am going to get an xray of your back.   If you start having weakness in your legs, or any loss of urine or bowel, come back.   To check your cholesterol, come back to the clinic for a lab appointment in the morning. Call 2 days before to make the appointment. Come to the lab fasting: no food or drink other than water after midnight the night before.   I'll see you back in 4 weeks.
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
## 2012-09-27 NOTE — Assessment & Plan Note (Addendum)
Refilled lisinopril/hctz 20/12.5. Patient to come for fasting lipid panel

## 2012-09-27 NOTE — Assessment & Plan Note (Addendum)
With evidence of neuropathy with radiating pain down the leg. Only very mild weakness on left dorsiflexion compared to right, suggesting L5 nerve root involvement, although he has a history of left foot pain which could be playing a role in this very mild decrease in strength. Osteoarthritis does also appear to play role given report of stiffness and improvement with exercise.  Will get plain film for now.  Treat pain with tramadol and tylenol Red flags for return reviewed.  Follow up in 4 weeks.

## 2012-09-27 NOTE — Assessment & Plan Note (Signed)
Patient applying for disability. I requested for him to obtain records from neurology so that I can have a diagnosis to include in letter of support.

## 2012-10-03 ENCOUNTER — Telehealth: Payer: Self-pay | Admitting: Family Medicine

## 2012-10-03 NOTE — Telephone Encounter (Signed)
Left message to return call. Please tell patient he needs to come in and sign a ROI for [place] to get his records from [facility].Busick, Rodena Medin

## 2012-10-03 NOTE — Telephone Encounter (Signed)
Pt is requesting that Dr. Gwenlyn Saran call [facility] [phone] and request medical records. So that Dr. Gwenlyn Saran can fill out disability papers. JW

## 2012-10-11 ENCOUNTER — Other Ambulatory Visit: Payer: Self-pay

## 2012-10-11 DIAGNOSIS — I10 Essential (primary) hypertension: Secondary | ICD-10-CM

## 2012-10-11 LAB — LIPID PANEL
Cholesterol: 281 mg/dL — ABNORMAL HIGH (ref 0–200)
HDL: 98 mg/dL (ref >39)
LDL Cholesterol: 165 mg/dL — ABNORMAL HIGH (ref 0–99)
Total CHOL/HDL Ratio: 2.9 Ratio
Triglycerides: 91 mg/dL (ref <150)
VLDL: 18 mg/dL (ref 0–40)

## 2012-10-12 ENCOUNTER — Encounter: Payer: Self-pay | Admitting: Family Medicine

## 2012-10-15 ENCOUNTER — Telehealth: Payer: Self-pay | Admitting: Family Medicine

## 2012-10-15 ENCOUNTER — Encounter: Payer: Self-pay | Admitting: Family Medicine

## 2012-10-15 NOTE — Telephone Encounter (Signed)
Pt is checking status of medical records being faxed to [facility]. Myriam Jacobson

## 2012-10-15 NOTE — Telephone Encounter (Signed)
Will fwd message to medical records.  Jeoffrey Eleazer, Darlyne Russian, CMA

## 2012-10-26 ENCOUNTER — Encounter: Payer: Self-pay | Admitting: Family Medicine

## 2012-10-26 ENCOUNTER — Ambulatory Visit (INDEPENDENT_AMBULATORY_CARE_PROVIDER_SITE_OTHER): Payer: Self-pay | Admitting: Family Medicine

## 2012-10-26 VITALS — BP 130/87 | HR 69 | Temp 98.2°F | Ht 68.0 in | Wt 151.8 lb

## 2012-10-26 DIAGNOSIS — M545 Low back pain, unspecified: Secondary | ICD-10-CM

## 2012-10-26 DIAGNOSIS — I10 Essential (primary) hypertension: Secondary | ICD-10-CM

## 2012-10-26 DIAGNOSIS — E785 Hyperlipidemia, unspecified: Secondary | ICD-10-CM

## 2012-10-26 DIAGNOSIS — F819 Developmental disorder of scholastic skills, unspecified: Secondary | ICD-10-CM

## 2012-10-26 MED ORDER — LISINOPRIL-HYDROCHLOROTHIAZIDE 20-12.5 MG PO TABS: 1.0000 | ORAL_TABLET | Freq: Every day | ORAL | Status: DC

## 2012-10-26 MED ORDER — CYCLOBENZAPRINE HCL 10 MG PO TABS: 10.0000 mg | ORAL_TABLET | Freq: Three times a day (TID) | ORAL | Status: DC | PRN

## 2012-10-26 MED ORDER — PRAVASTATIN SODIUM 80 MG PO TABS: 80.0000 mg | ORAL_TABLET | Freq: Every day | ORAL | Status: DC

## 2012-10-26 MED ORDER — TRAMADOL HCL 50 MG PO TABS: 50.0000 mg | ORAL_TABLET | Freq: Three times a day (TID) | ORAL | Status: DC | PRN

## 2012-10-26 NOTE — Patient Instructions (Signed)
If you haven't heard from me middle of next week, please call the office at [phone]

## 2012-10-27 NOTE — Assessment & Plan Note (Signed)
Patient not taking pravastatin. Rx for pravasatin 80mg  daily since with calculating of cardiovascular risk score, he would benefit from moderate dose statin.

## 2012-10-27 NOTE — Progress Notes (Signed)
Patient ID: George Schroeder    DOB: 05-02-1965, 47 y.o.   MRN: [id number] --- Subjective:  George Schroeder is a 47 y.o.male who presents for follow up on back pain and review of labs.  - back pain: continues to be painful in lower back on both sides of spine. Feels like "spasms". 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 Worst with physical activity. Better with rest. He takes tramadol which helps. No weakness, bowel or urinary incontinence.   - hyperlipidemia: had recent blood work which showed LDL: 165. He admits to not taking pravastatin.   ROS: see HPI Past Medical History: reviewed and updated medications and allergies. Social History: Tobacco: none  Objective: Filed Vitals:   10/26/12 1039  BP: 130/87  Pulse: 69  Temp: 98.2 F (36.8 C)    Physical Examination:   General appearance - alert, well appearing, and in no distress Chest - clear to auscultation, no wheezes, rales or rhonchi, symmetric air entry Heart - normal rate, regular rhythm, normal S1, S2, no murmurs, rubs, clicks or gallops Back - stiff with extension more than flexion, tenderness along right paralumbar muscle with increased muscle tone and spasticity, no tenderness along spinous process.  5/5 strength with knee flexion and extension and hip flexion bilaterally  4+/5 strength with left dorsiflexion of foot compared to 5/5 strength in right.  Normal plantarflexion bilaterally  Patellar reflexes 1+ and symmetric

## 2012-10-27 NOTE — Assessment & Plan Note (Signed)
Associated with paralumbar spasms. Rx for flexeril. Refilled tramadol. Xray to evaluate for any arthritic process.

## 2012-10-27 NOTE — Assessment & Plan Note (Addendum)
Will obtain records form care everywhere of neurology consult. Will then write letter stating diagnoses for disability letter.

## 2012-10-31 ENCOUNTER — Telehealth: Payer: Self-pay | Admitting: Family Medicine

## 2012-11-01 ENCOUNTER — Telehealth: Payer: Self-pay | Admitting: *Deleted

## 2012-11-01 NOTE — Telephone Encounter (Signed)
Message copied by Radene Ou on Thu Nov 01, 2012  8:51 AM ------      Message from: Marena Chancy E      Created: Wed Oct 31, 2012 10:25 AM       Hi Baxter Hire,      Would you mind calling Mr. Bentler and letting him know that I got the records from [facility] and will have the letter ready by tomorrow for him to pick up at the front desk.       Thank you so much,      Judeth Cornfield ------

## 2012-11-01 NOTE — Telephone Encounter (Signed)
Left message for patient to call back.  Please advise letter ready for pick up.  Please see MD message below.  Zuma Hust, Darlyne Russian, CMA

## 2012-11-02 NOTE — Telephone Encounter (Signed)
Letter and [facility] notes left at front desk on 11/01/12 for patient to pick up. Patient was called and notified on phone message by Haskel Schroeder.   Marena Chancy, PGY-3 Family Medicine Resident

## 2012-11-06 ENCOUNTER — Ambulatory Visit: Payer: Self-pay

## 2013-01-14 ENCOUNTER — Ambulatory Visit: Payer: Self-pay

## 2013-02-05 ENCOUNTER — Telehealth: Payer: Self-pay | Admitting: Family Medicine

## 2013-02-05 DIAGNOSIS — I10 Essential (primary) hypertension: Secondary | ICD-10-CM

## 2013-02-05 MED ORDER — LISINOPRIL-HYDROCHLOROTHIAZIDE 20-12.5 MG PO TABS: 1.0000 | ORAL_TABLET | Freq: Every day | ORAL | Status: DC

## 2013-02-05 NOTE — Telephone Encounter (Signed)
Pt is out of his blood pressure medication. He doesn't know if he needs a new prescripton or not Please advise

## 2013-02-05 NOTE — Telephone Encounter (Signed)
Please let patient know that I refilled his blood pressure medicine medicine directly to his listed pharmacy. Thank you!  Marena Chancy, PGY-3 Family Medicine Resident

## 2013-02-05 NOTE — Telephone Encounter (Signed)
Will fwd. To PCP for review .Talar Fraley  

## 2013-02-06 NOTE — Telephone Encounter (Signed)
LMVM see message below.  Aliegha Paullin, Darlyne Russian, CMA

## 2013-04-26 ENCOUNTER — Encounter: Payer: Self-pay | Admitting: Family Medicine

## 2013-06-04 ENCOUNTER — Encounter (HOSPITAL_COMMUNITY): Payer: Self-pay | Admitting: Emergency Medicine

## 2013-06-04 ENCOUNTER — Emergency Department (HOSPITAL_COMMUNITY): Payer: Self-pay

## 2013-06-04 ENCOUNTER — Emergency Department (HOSPITAL_COMMUNITY)
Admission: EM | Admit: 2013-06-04 | Discharge: 2013-06-04 | Disposition: A | Discharge status: Home / Self Care | Payer: Self-pay | Attending: Emergency Medicine | Admitting: Emergency Medicine

## 2013-06-04 DIAGNOSIS — L089 Local infection of the skin and subcutaneous tissue, unspecified: Secondary | ICD-10-CM | POA: Insufficient documentation

## 2013-06-04 DIAGNOSIS — R000 Tachycardia, unspecified: Secondary | ICD-10-CM | POA: Insufficient documentation

## 2013-06-04 DIAGNOSIS — Z862 Personal history of diseases of the blood and blood-forming organs and certain disorders involving the immune mechanism: Secondary | ICD-10-CM | POA: Insufficient documentation

## 2013-06-04 DIAGNOSIS — J40 Bronchitis, not specified as acute or chronic: Secondary | ICD-10-CM

## 2013-06-04 DIAGNOSIS — J209 Acute bronchitis, unspecified: Secondary | ICD-10-CM | POA: Insufficient documentation

## 2013-06-04 DIAGNOSIS — Z8639 Personal history of other endocrine, nutritional and metabolic disease: Secondary | ICD-10-CM | POA: Insufficient documentation

## 2013-06-04 DIAGNOSIS — I10 Essential (primary) hypertension: Secondary | ICD-10-CM | POA: Insufficient documentation

## 2013-06-04 DIAGNOSIS — Z87891 Personal history of nicotine dependence: Secondary | ICD-10-CM | POA: Insufficient documentation

## 2013-06-04 DIAGNOSIS — Z79899 Other long term (current) drug therapy: Secondary | ICD-10-CM | POA: Insufficient documentation

## 2013-06-04 LAB — BASIC METABOLIC PANEL
BUN: 14 mg/dL (ref 6–23)
CO2: 31 mEq/L (ref 19–32)
Calcium: 8.9 mg/dL (ref 8.4–10.5)
Chloride: 93 mEq/L — ABNORMAL LOW (ref 96–112)
Creatinine, Ser: 1.45 mg/dL — ABNORMAL HIGH (ref 0.50–1.35)
GFR calc Af Amer: 64 mL/min — ABNORMAL LOW (ref >90)
GFR calc non Af Amer: 55 mL/min — ABNORMAL LOW (ref >90)
Glucose, Bld: 109 mg/dL — ABNORMAL HIGH (ref 70–99)
Potassium: 4 mEq/L (ref 3.7–5.3)
Sodium: 135 mEq/L — ABNORMAL LOW (ref 137–147)

## 2013-06-04 LAB — CBC WITH DIFFERENTIAL/PLATELET
Basophils Absolute: 0 10*3/uL (ref 0.0–0.1)
Basophils Relative: 1 % (ref 0–1)
Eosinophils Absolute: 0 10*3/uL (ref 0.0–0.7)
Eosinophils Relative: 1 % (ref 0–5)
HCT: 40.2 % (ref 39.0–52.0)
Hemoglobin: 13.6 g/dL (ref 13.0–17.0)
Lymphocytes Relative: 19 % (ref 12–46)
Lymphs Abs: 1.2 10*3/uL (ref 0.7–4.0)
MCH: 32.1 pg (ref 26.0–34.0)
MCHC: 33.8 g/dL (ref 30.0–36.0)
MCV: 94.8 fL (ref 78.0–100.0)
Monocytes Absolute: 0.8 10*3/uL (ref 0.1–1.0)
Monocytes Relative: 13 % — ABNORMAL HIGH (ref 3–12)
Neutro Abs: 4 10*3/uL (ref 1.7–7.7)
Neutrophils Relative %: 67 % (ref 43–77)
Platelets: 225 10*3/uL (ref 150–400)
RBC: 4.24 MIL/uL (ref 4.22–5.81)
RDW: 12.3 % (ref 11.5–15.5)
WBC: 6 10*3/uL (ref 4.0–10.5)

## 2013-06-04 LAB — PRO B NATRIURETIC PEPTIDE: Pro B Natriuretic peptide (BNP): 68.3 pg/mL (ref 0–125)

## 2013-06-04 LAB — HEPATIC FUNCTION PANEL
ALT: 45 U/L (ref 0–53)
AST: 35 U/L (ref 0–37)
Albumin: 3.5 g/dL (ref 3.5–5.2)
Alkaline Phosphatase: 110 U/L (ref 39–117)
Bilirubin, Direct: <0.2 mg/dL (ref 0.0–0.3)
Total Bilirubin: 0.3 mg/dL (ref 0.3–1.2)
Total Protein: 7.6 g/dL (ref 6.0–8.3)

## 2013-06-04 LAB — D-DIMER, QUANTITATIVE: D-Dimer, Quant: 0.77 ug/mL-FEU — ABNORMAL HIGH (ref 0.00–0.48)

## 2013-06-04 LAB — TROPONIN I: Troponin I: <0.3 ng/mL (ref <0.30)

## 2013-06-04 MED ORDER — IOHEXOL 350 MG/ML SOLN
100.0000 mL | Freq: Once | INTRAVENOUS | Status: AC | PRN
  Administered 2013-06-04: 100 mL via INTRAVENOUS

## 2013-06-04 MED ORDER — CEPHALEXIN 500 MG PO CAPS: 500.0000 mg | ORAL_CAPSULE | Freq: Four times a day (QID) | ORAL | Status: DC

## 2013-06-04 NOTE — ED Notes (Signed)
MD at bedside. 

## 2013-06-04 NOTE — Discharge Instructions (Signed)
Follow up with dr. Lovell SheehanJenkins in 1-2 days for your back.  See your family md if cough does not improve

## 2013-06-04 NOTE — ED Notes (Signed)
[facility] at ~ 1000 after taking cough medication. Pt states ache to left chest and "tightness in abdomen" Pt was given ASA 325po in office and NTG SGx 1. Pt currently denies pain . Pain was rated at 2/10 in the office. Pt was also tachy in the office with HR of 117 per EKG.

## 2013-06-04 NOTE — Care Management Note (Signed)
ED/CM noted patient did not have health insurance and/or PCP listed in the computer.  Patient was given the [place] resource handout with information on the clinics, food pantries, and the handout for new health insurance sign-up.  Patient expressed appreciation for information received. 

## 2013-06-04 NOTE — ED Provider Notes (Signed)
CSN: [id number]     Arrival date & time 06/04/13  1040 History  This chart was scribed for George Lennert, MD,  by Ashley Jacobs, ED Scribe. The patient was seen in room APA09/APA09 and the patient's care was started at 10:58 AM.    First MD Initiated Contact with Patient 06/04/13 1050     Chief Complaint  Patient presents with  . Chest Pain     (Consider location/radiation/quality/duration/timing/severity/associated sxs/prior Treatment) Patient is a 48 y.o. male presenting with chest pain. The history is provided by the patient and medical records. No language interpreter was used.  Chest Pain Associated symptoms: cough and shortness of breath    HPI Comments: ORLIE CUNDARI is a 48 y.o. male who presents to the Emergency Department complaining of constant left aching chest pain for the past hour. He has associated "tightness in his abdomen". Pt has a productive cough ( blood tinged this am) and mild SOB. [facility] of [facility]cardia after taking cold medication. He was given ASA po in office and was advised to come to go the ED by the facility. He report having a large cyst on his upper back that presents with pain and swelling. He is currently taking antibiotics. Denies any significant improvements to the site.   Past Medical History  Diagnosis Date  . Hypertension   . Alcohol abuse   . Hyperlipidemia    Past Surgical History  Procedure Laterality Date  . Facial reconstruction surgery     Family History  Problem Relation Age of Onset  . Hypertension Mother   . Alcohol abuse Father   . Heart disease Father   . Stroke Father   . Cancer Father    History  Substance Use Topics  . Smoking status: Former Smoker -- 10 years  . Smokeless tobacco: Never Used  . Alcohol Use: 1.8 oz/week    3 Cans of beer per week    Review of Systems  Respiratory: Positive for cough and shortness of breath.   Cardiovascular: Positive for chest pain.   Tachycardic   Skin:       Cyst to upper back   All other systems reviewed and are negative.      Allergies  Review of patient's allergies indicates no known allergies.  Home Medications   Current Outpatient Rx  Name  Route  Sig  Dispense  Refill  . ibuprofen (ADVIL,MOTRIN) 600 MG tablet   Oral   Take 1 tablet (600 mg total) by mouth every 8 (eight) hours as needed for pain.   60 tablet   0   . ibuprofen (ADVIL,MOTRIN) 600 MG tablet   Oral   Take 1 tablet (600 mg total) by mouth every 8 (eight) hours as needed for pain. Take with food.   45 tablet   1   . lisinopril-hydrochlorothiazide (PRINZIDE) 20-12.5 MG per tablet   Oral   Take 1 tablet by mouth daily.   30 tablet   6    BP 126/74  Pulse 114  Temp(Src) 98.8 F (37.1 C) (Oral)  Resp 20  Ht 5\' 8"  (1.727 m)  Wt 174 lb (78.926 kg)  BMI 26.46 kg/m2  SpO2 97% Physical Exam  Constitutional: He is oriented to person, place, and time. He appears well-developed and well-nourished. No distress.  HENT:  Head: Normocephalic.  Eyes: Conjunctivae and EOM are normal. No scleral icterus.  Neck: Neck supple. No thyromegaly present.  Cardiovascular: Normal rate.  Exam reveals no gallop and no friction rub.   No murmur heard. Tachycardic    Pulmonary/Chest: No stridor. He has no wheezes (minimal bilateral wheezing). He has no rales. He exhibits no tenderness.  Abdominal: He exhibits no distension. There is no tenderness. There is no rebound.  Musculoskeletal: Normal range of motion.    6 cm x 6 cm cyst to the upper back that is tender  Lymphadenopathy:    He has no cervical adenopathy.  Neurological: He is oriented to person, place, and time. He exhibits normal muscle tone. Coordination normal.  Skin: No rash noted. He is not diaphoretic. No erythema.  Psychiatric: He has a normal mood and affect. His behavior is normal.    ED Course  Procedures (including critical care time) DIAGNOSTIC STUDIES: Oxygen Saturation  is 97% on room air, normal by my interpretation.    COORDINATION OF CARE:  11:02 AM Discussed course of care with pt which includes EKG, chest x-ray and laboratory tests . Pt understands and agrees.    Labs Review Labs Reviewed  CBC WITH DIFFERENTIAL  BASIC METABOLIC PANEL  TROPONIN I   Imaging Review No results found.   EKG Interpretation   Date/Time:  Tuesday June 04 2013 10:46:57 EDT Ventricular Rate:  119 PR Interval:  130 QRS Duration: 84 QT Interval:  326 QTC Calculation: 458 R Axis:   51 Text Interpretation:  Sinus tachycardia Nonspecific T wave abnormality  Abnormal ECG When compared with ECG of 12-Mar-2012 11:39, Vent. rate has  increased BY  44 BPM Nonspecific T wave abnormality now evident in  Anterior leads Confirmed by Nery Frappier  MD, Ashawna Hanback [phone]([id number]) on 06/04/2013  3:12:18 PM      MDM   Final diagnoses:  None    Bronchitis and infected mass on upper back.  Pt to follow up with surgery this week.   The chart was scribed for me under my direct supervision.  I personally performed the history, physical, and medical decision making and all procedures in the evaluation of this patient.George Schroeder.    Ginnifer Creelman L Johan Creveling, MD 06/04/13 [phone]

## 2013-07-10 ENCOUNTER — Ambulatory Visit (INDEPENDENT_AMBULATORY_CARE_PROVIDER_SITE_OTHER): Payer: Self-pay | Admitting: Cardiology

## 2013-07-10 ENCOUNTER — Encounter: Payer: Self-pay | Admitting: Cardiology

## 2013-07-10 ENCOUNTER — Encounter (INDEPENDENT_AMBULATORY_CARE_PROVIDER_SITE_OTHER): Payer: Self-pay

## 2013-07-10 VITALS — BP 125/52 | HR 89 | Ht 68.0 in | Wt 172.0 lb

## 2013-07-10 DIAGNOSIS — I498 Other specified cardiac arrhythmias: Secondary | ICD-10-CM

## 2013-07-10 DIAGNOSIS — R011 Cardiac murmur, unspecified: Secondary | ICD-10-CM

## 2013-07-10 DIAGNOSIS — R0789 Other chest pain: Secondary | ICD-10-CM

## 2013-07-10 DIAGNOSIS — E785 Hyperlipidemia, unspecified: Secondary | ICD-10-CM

## 2013-07-10 DIAGNOSIS — R000 Tachycardia, unspecified: Secondary | ICD-10-CM | POA: Insufficient documentation

## 2013-07-10 DIAGNOSIS — I10 Essential (primary) hypertension: Secondary | ICD-10-CM

## 2013-07-10 NOTE — Assessment & Plan Note (Signed)
Does not sound anginal based on description. Recent ECG nonspecific and troponin I level negative at ER visit. He actually underwent an exercise echocardiogram that was negative for ischemia in August 2013. No further ischemic workup at this point.

## 2013-07-10 NOTE — Assessment & Plan Note (Signed)
Will obtain a complete echocardiogram for further evaluation.

## 2013-07-10 NOTE — Assessment & Plan Note (Signed)
Recent LDL 141. Has been on statin therapy in the past. [facility]

## 2013-07-10 NOTE — Assessment & Plan Note (Signed)
Blood pressure control is adequate today. He is now on combination of ACE inhibitor with HCTZ, also recent addition of beta blocker.

## 2013-07-10 NOTE — Assessment & Plan Note (Addendum)
Resolved, no definite arrhythmia otherwise documented. Agree with the addition of beta blocker with the current management of hypertension. Unless he develops definite sense of palpitations or has syncope, would hold off on cardiac monitoring.

## 2013-07-10 NOTE — Progress Notes (Signed)
Clinical Summary [name] is a 48 y.o.male referred for cardiology consultation [facility] I reviewed the available database in EPIC and also the recent notes provided. He was sent to the ER fairly recently due to chest pain symptoms and tachycardia noted on examination. He describes intermittent, sharp, chest pains that are without precipitant - the symptoms of been going on for quite some time. Has been having trouble with mild cough and some rhinorrhea as well. He was diagnosed with bronchitis at his ER visit.  Lab work from March showed hemoglobin 13.7, platelets 284, sodium 136, potassium 4.2, BUN 9, creatinine 1.0, AST 67, ALT 97, cholesterol 232, triglycerides 55, HDL 80, LDL 141, TSH 2.4, hemoglobin A1c 5.9, hepatitis panel negative, proBNP normal at 68, troponin I negative at less than 0.30. He was also seen in the ER back in March, had a CT angiogram of the chest that showed no acute infiltrates or evidence of pulmonary edema, no pulmonary embolus or thoracic aortic pathology. ECG from March showed sinus tachycardia with nonspecific T-wave changes. [facility] tachy[facility] with nonspecific ST-T changes.  Patient underwent an exercise echocardiogram back in August 2013 that was negative for ischemia. He was seen by Dr.Peter SwazilandJordan at that time for evaluation of shortness of breath.  No Known Allergies  Current Outpatient Prescriptions  Medication Sig Dispense Refill  . benzonatate (TESSALON) 100 MG capsule Take by mouth 3 (three) times daily as needed for cough.      . DULoxetine (CYMBALTA) 30 MG capsule Take 30 mg by mouth daily.      Marland Kitchen. lisinopril-hydrochlorothiazide (PRINZIDE) 20-12.5 MG per tablet Take 1 tablet by mouth daily.  30 tablet  6  . metoprolol tartrate (LOPRESSOR) 25 MG tablet Take 25 mg by mouth 2 (two) times daily.       No current facility-administered medications for this visit.    Past Medical History    Diagnosis Date  . Essential hypertension, benign   . Alcohol abuse   . Hyperlipidemia     Past Surgical History  Procedure Laterality Date  . Facial reconstruction surgery      Family History  Problem Relation Age of Onset  . Hypertension Mother   . Alcohol abuse Father   . Heart disease Father   . Stroke Father   . Cancer Father     Social History [name] reports that he has quit smoking. His smoking use included Cigarettes. He smoked 0.00 packs per day for 10 years. He has never used smokeless tobacco. [name] reports that he drinks about 1.8 ounces of alcohol per week.  Review of Systems He denies any sense of palpitations, has had no dizziness or syncope. No reproducible exertional chest pain. Otherwise as outlined above.  Physical Examination Filed Vitals:   07/10/13 0918  BP: 125/52  Pulse: 89   Filed Weights   07/10/13 0918  Weight: 172 lb (78.019 kg)   Patient appears comfortable at rest. HEENT: Conjunctiva and lids normal, oropharynx clear with poor dentition. Neck: Supple, no elevated JVP or carotid bruits, no thyromegaly. Lungs: Clear to auscultation, nonlabored breathing at rest. Cardiac: Regular rate and rhythm, S4, soft systolic murmur at apex, no pericardial rub. Abdomen: Soft, nontender, bowel sounds present, no guarding or rebound. Extremities: No pitting edema, distal pulses 2+. Skin: Warm and dry. Musculoskeletal: No kyphosis. Neuropsychiatric: Alert and oriented x3, affect grossly appropriate.   Problem List and Plan  Atypical chest pain Does not sound anginal based on description. Recent ECG nonspecific and troponin I level negative at ER visit. He actually underwent an exercise echocardiogram that was negative for ischemia in August 2013. No further ischemic workup at this point.  Cardiac murmur Will obtain a complete echocardiogram for further evaluation.  HYPERLIPIDEMIA Recent LDL 141. Has been on statin therapy in the past. [facility]  HYPERTENSION, BENIGN SYSTEMIC Blood pressure control is adequate today. He is now on combination of ACE inhibitor with HCTZ, also recent addition of beta blocker.  Sinus tachycardia Resolved, no definite arrhythmia otherwise documented. Agree with the addition of beta blocker with the current management of hypertension. Unless he develops definite sense of palpitations or has syncope, would hold off on cardiac monitoring.    Jonelle SidleSamuel G. Tallon Gertz, M.D., F.A.C.C.

## 2013-07-10 NOTE — Patient Instructions (Signed)
Your physician recommends that you schedule a follow-up appointment to be determined after test    Your physician has requested that you have an echocardiogram. Echocardiography is a painless test that uses sound waves to create images of your heart. It provides your doctor with information about the size and shape of your heart and how well your heart's chambers and valves are working. This procedure takes approximately one hour. There are no restrictions for this procedure.      Your physician recommends that you continue on your current medications as directed. Please refer to the Current Medication list given to you today.     Thank you for choosing [facility] !

## 2013-07-11 ENCOUNTER — Ambulatory Visit (HOSPITAL_COMMUNITY)
Admission: RE | Admit: 2013-07-11 | Discharge: 2013-07-11 | Disposition: A | Discharge status: Home / Self Care | Payer: Self-pay | Source: Ambulatory Visit | Attending: Cardiology | Admitting: Cardiology

## 2013-07-11 DIAGNOSIS — R011 Cardiac murmur, unspecified: Secondary | ICD-10-CM | POA: Insufficient documentation

## 2013-07-11 DIAGNOSIS — Z87891 Personal history of nicotine dependence: Secondary | ICD-10-CM | POA: Insufficient documentation

## 2013-07-11 DIAGNOSIS — I10 Essential (primary) hypertension: Secondary | ICD-10-CM | POA: Insufficient documentation

## 2013-07-11 DIAGNOSIS — R0789 Other chest pain: Secondary | ICD-10-CM | POA: Insufficient documentation

## 2013-07-11 DIAGNOSIS — I379 Nonrheumatic pulmonary valve disorder, unspecified: Secondary | ICD-10-CM

## 2013-07-11 DIAGNOSIS — E785 Hyperlipidemia, unspecified: Secondary | ICD-10-CM | POA: Insufficient documentation

## 2013-07-11 DIAGNOSIS — F1010 Alcohol abuse, uncomplicated: Secondary | ICD-10-CM | POA: Insufficient documentation

## 2013-07-11 NOTE — Progress Notes (Signed)
*  PRELIMINARY RESULTS* Echocardiogram 2D Echocardiogram has been performed.  Katheren PullerJohanna R Jonnell Hentges 07/11/2013, 2:28 PM

## 2013-10-17 ENCOUNTER — Emergency Department (HOSPITAL_COMMUNITY)
Admission: EM | Admit: 2013-10-17 | Discharge: 2013-10-17 | Disposition: A | Discharge status: Home / Self Care | Payer: Self-pay | Attending: Emergency Medicine | Admitting: Emergency Medicine

## 2013-10-17 ENCOUNTER — Encounter (HOSPITAL_COMMUNITY): Payer: Self-pay | Admitting: Emergency Medicine

## 2013-10-17 DIAGNOSIS — F411 Generalized anxiety disorder: Secondary | ICD-10-CM | POA: Insufficient documentation

## 2013-10-17 DIAGNOSIS — Z87891 Personal history of nicotine dependence: Secondary | ICD-10-CM | POA: Insufficient documentation

## 2013-10-17 DIAGNOSIS — Z79899 Other long term (current) drug therapy: Secondary | ICD-10-CM | POA: Insufficient documentation

## 2013-10-17 DIAGNOSIS — I10 Essential (primary) hypertension: Secondary | ICD-10-CM | POA: Insufficient documentation

## 2013-10-17 DIAGNOSIS — R040 Epistaxis: Secondary | ICD-10-CM | POA: Insufficient documentation

## 2013-10-17 DIAGNOSIS — Z862 Personal history of diseases of the blood and blood-forming organs and certain disorders involving the immune mechanism: Secondary | ICD-10-CM | POA: Insufficient documentation

## 2013-10-17 DIAGNOSIS — Z8639 Personal history of other endocrine, nutritional and metabolic disease: Secondary | ICD-10-CM | POA: Insufficient documentation

## 2013-10-17 HISTORY — DX: Anxiety disorder, unspecified: F41.9

## 2013-10-17 MED ORDER — SILVER NITRATE-POT NITRATE 75-25 % EX MISC
CUTANEOUS | Status: AC
  Administered 2013-10-17: 04:00:00
  Filled 2013-10-17: qty 2

## 2013-10-17 NOTE — ED Notes (Signed)
Pt states his nose started bleeding about 10 minutes ago.

## 2013-10-17 NOTE — ED Provider Notes (Signed)
CSN: [id number]     Arrival date & time 10/17/13  0005 History   First MD Initiated Contact with Patient 10/17/13 0345     Chief Complaint  Patient presents with  . Epistaxis     (Consider location/radiation/quality/duration/timing/severity/associated sxs/prior Treatment) HPI Less than one hour nosebleed right nostril spontaneously resolved prior to arrival, no light headed, no CP, no fever, no SOB, no trauma, no other concerns.   Right anterior septal mucosal recent bleeding site  Silver nitrate cautery apply to right nasal septal mucosa with good eschar formation   Past Medical History  Diagnosis Date  . Essential hypertension, benign   . Alcohol abuse   . Hyperlipidemia   . Anxiety    Past Surgical History  Procedure Laterality Date  . Facial reconstruction surgery     Family History  Problem Relation Age of Onset  . Hypertension Mother   . Alcohol abuse Father   . Heart disease Father   . Stroke Father   . Cancer Father    History  Substance Use Topics  . Smoking status: Former Smoker -- 10 years    Types: Cigarettes  . Smokeless tobacco: Never Used  . Alcohol Use: 1.8 oz/week    3 Cans of beer per week    Review of Systems  10 Systems reviewed and are negative for acute change except as noted in the HPI.  Allergies  Review of patient's allergies indicates no known allergies.  Home Medications   Prior to Admission medications   Medication Sig Start Date End Date Taking? Authorizing Provider  DULoxetine (CYMBALTA) 30 MG capsule Take 30 mg by mouth daily.   Yes Historical Provider, MD  lisinopril-hydrochlorothiazide (PRINZIDE) 20-12.5 MG per tablet Take 1 tablet by mouth daily. 02/05/13 02/05/14 Yes Lonia SkinnerStephanie E Losq, MD  metoprolol tartrate (LOPRESSOR) 25 MG tablet Take 25 mg by mouth 2 (two) times daily.   Yes Historical Provider, MD  benzonatate (TESSALON) 100 MG capsule Take by mouth 3 (three) times daily as needed for cough.    Historical Provider, MD    BP 158/88  Pulse 120  Temp(Src) 98.4 F (36.9 C) (Oral)  Resp 20  Ht 5\' 8"  (1.727 m)  Wt 184 lb (83.462 kg)  BMI 27.98 kg/m2  SpO2 99% Physical Exam  Nursing note and vitals reviewed. Constitutional:  Awake, alert, nontoxic appearance.  HENT:  Head: Atraumatic.  Mouth/Throat: Oropharynx is clear and moist.  Right anterior mucosal septal recent bleeding site without active bleeding  Eyes: Right eye exhibits no discharge. Left eye exhibits no discharge.  Neck: Neck supple.  Cardiovascular: Normal rate and regular rhythm.   No murmur heard. Pulmonary/Chest: Effort normal and breath sounds normal. No respiratory distress. He has no wheezes. He has no rales. He exhibits no tenderness.  Abdominal: Soft. There is no tenderness. There is no rebound.  Musculoskeletal: He exhibits no tenderness.  Baseline ROM, no obvious new focal weakness.  Neurological: He is alert.  Mental status and motor strength appears baseline for patient and situation.  Skin: No rash noted.  Psychiatric: He has a normal mood and affect.    ED Course  Procedures (including critical care time) Silver nitrate cautery applied to form eschar to recent right septal mucosal bleeding site; ime out taken; Pt tolerated procedure well; no apparent immediate complications. Labs Review Labs Reviewed - No data to display  Imaging Review No results found.   EKG Interpretation None      MDM   Final diagnoses:  Anterior epistaxis    Patient / Family / Caregiver informed of clinical course, understand medical decision-making process, and agree with plan.  I doubt any other EMC precluding discharge at this time including, but not necessarily limited to the following:SBI, uncontrolled bleeding.    Hurman Horn, MD 10/19/13 1346

## 2013-10-17 NOTE — Discharge Instructions (Signed)
Nosebleed °A nosebleed can be caused by many things, including: °· Getting hit hard in the nose. °· Infections. °· Dry nose. °· Colds. °· Medicines. °Your doctor may do lab testing if you get nosebleeds a lot and the cause is not known. °HOME CARE  °· If your nose was packed with material, keep it there until your doctor takes it out. Put the pack back in your nose if the pack falls out. °· Do not blow your nose for 12 hours after the nosebleed. °· Sit up and bend forward if your nose starts bleeding again. Pinch the front half of your nose nonstop for 20 minutes. °· Put petroleum jelly inside your nose every morning if you have a dry nose. °· Use a humidifier to make the air less dry. °· Do not take aspirin. °· Try not to strain, lift, or bend at the waist for many days after the nosebleed. °GET HELP RIGHT AWAY IF:  °· Nosebleeds keep happening and are hard to stop or control. °· You have bleeding or bruises that are not normal on other parts of the body. °· You have a fever. °· The nosebleeds get worse. °· You get lightheaded, feel faint, sweaty, or throw up (vomit) blood. °MAKE SURE YOU:  °· Understand these instructions. °· Will watch your condition. °· Will get help right away if you are not doing well or get worse. °Document Released: 12/08/2007 Document Revised: 05/23/2011 Document Reviewed: 12/08/2007 °Exit[employer]® Patient Information ©2015 [employer]. This information is not intended to replace advice given to you by your health care provider. Make sure you discuss any questions you have with your health care provider. ° °

## 2013-10-17 NOTE — ED Notes (Signed)
MD at bedside. 

## 2013-10-17 NOTE — ED Notes (Addendum)
Pt reports a nose bleed from the right nostril. Denied any injury or trauma to the nose. States he spit a clot out and then the bleeding stopped roughly about the time of triage. Nad noted at present.

## 2013-11-27 ENCOUNTER — Other Ambulatory Visit: Payer: Self-pay | Admitting: *Deleted

## 2013-11-27 DIAGNOSIS — I10 Essential (primary) hypertension: Secondary | ICD-10-CM

## 2013-11-27 MED ORDER — LISINOPRIL-HYDROCHLOROTHIAZIDE 20-12.5 MG PO TABS: 1.0000 | ORAL_TABLET | Freq: Every day | ORAL | Status: DC

## 2013-11-27 NOTE — Telephone Encounter (Signed)
Pt informed that rx refilled. And will call back to make an appt to meet new dr. Bruna Schroeder, Hargis Vandyne CMA

## 2013-11-27 NOTE — Telephone Encounter (Signed)
Please let patient know that I refilled his Rx x 1 refill. Additionally, please have him make an appointment with me in the next few months as I have never met him.  Thanks! Kathrine Cords, MD Family Medicine resident

## 2014-11-06 ENCOUNTER — Encounter (HOSPITAL_COMMUNITY): Payer: Self-pay | Admitting: Cardiology

## 2014-11-06 ENCOUNTER — Emergency Department (HOSPITAL_COMMUNITY)
Admission: EM | Admit: 2014-11-06 | Discharge: 2014-11-06 | Disposition: A | Discharge status: Home / Self Care | Payer: Self-pay | Attending: Emergency Medicine | Admitting: Emergency Medicine

## 2014-11-06 ENCOUNTER — Emergency Department (HOSPITAL_COMMUNITY): Payer: Self-pay

## 2014-11-06 DIAGNOSIS — K760 Fatty (change of) liver, not elsewhere classified: Secondary | ICD-10-CM | POA: Insufficient documentation

## 2014-11-06 DIAGNOSIS — R197 Diarrhea, unspecified: Secondary | ICD-10-CM | POA: Insufficient documentation

## 2014-11-06 DIAGNOSIS — Z87891 Personal history of nicotine dependence: Secondary | ICD-10-CM | POA: Insufficient documentation

## 2014-11-06 DIAGNOSIS — R509 Fever, unspecified: Secondary | ICD-10-CM | POA: Insufficient documentation

## 2014-11-06 DIAGNOSIS — Z8659 Personal history of other mental and behavioral disorders: Secondary | ICD-10-CM | POA: Insufficient documentation

## 2014-11-06 DIAGNOSIS — R000 Tachycardia, unspecified: Secondary | ICD-10-CM | POA: Insufficient documentation

## 2014-11-06 DIAGNOSIS — R740 Nonspecific elevation of levels of transaminase and lactic acid dehydrogenase [LDH]: Secondary | ICD-10-CM | POA: Insufficient documentation

## 2014-11-06 DIAGNOSIS — I10 Essential (primary) hypertension: Secondary | ICD-10-CM | POA: Insufficient documentation

## 2014-11-06 DIAGNOSIS — R7401 Elevation of levels of liver transaminase levels: Secondary | ICD-10-CM

## 2014-11-06 DIAGNOSIS — Z8639 Personal history of other endocrine, nutritional and metabolic disease: Secondary | ICD-10-CM | POA: Insufficient documentation

## 2014-11-06 LAB — CBC WITH DIFFERENTIAL/PLATELET
Basophils Absolute: 0 K/uL (ref 0.0–0.1)
Basophils Relative: 0 % (ref 0–1)
Eosinophils Absolute: 0 K/uL (ref 0.0–0.7)
Eosinophils Relative: 0 % (ref 0–5)
HCT: 42 % (ref 39.0–52.0)
Hemoglobin: 14.5 g/dL (ref 13.0–17.0)
Lymphocytes Relative: 13 % (ref 12–46)
Lymphs Abs: 1.4 K/uL (ref 0.7–4.0)
MCH: 32.1 pg (ref 26.0–34.0)
MCHC: 34.5 g/dL (ref 30.0–36.0)
MCV: 92.9 fL (ref 78.0–100.0)
Monocytes Absolute: 1.5 K/uL — ABNORMAL HIGH (ref 0.1–1.0)
Monocytes Relative: 13 % — ABNORMAL HIGH (ref 3–12)
Neutro Abs: 8.4 K/uL — ABNORMAL HIGH (ref 1.7–7.7)
Neutrophils Relative %: 74 % (ref 43–77)
Platelets: 279 K/uL (ref 150–400)
RBC: 4.52 MIL/uL (ref 4.22–5.81)
RDW: 13.7 % (ref 11.5–15.5)
WBC: 11.3 K/uL — ABNORMAL HIGH (ref 4.0–10.5)

## 2014-11-06 LAB — COMPREHENSIVE METABOLIC PANEL WITH GFR
ALT: 86 U/L — ABNORMAL HIGH (ref 17–63)
AST: 120 U/L — ABNORMAL HIGH (ref 15–41)
Albumin: 3.1 g/dL — ABNORMAL LOW (ref 3.5–5.0)
Alkaline Phosphatase: 131 U/L — ABNORMAL HIGH (ref 38–126)
Anion gap: 12 (ref 5–15)
BUN: 22 mg/dL — ABNORMAL HIGH (ref 6–20)
CO2: 27 mmol/L (ref 22–32)
Calcium: 8.7 mg/dL — ABNORMAL LOW (ref 8.9–10.3)
Chloride: 93 mmol/L — ABNORMAL LOW (ref 101–111)
Creatinine, Ser: 1.64 mg/dL — ABNORMAL HIGH (ref 0.61–1.24)
GFR calc Af Amer: 55 mL/min — ABNORMAL LOW (ref >60)
GFR calc non Af Amer: 47 mL/min — ABNORMAL LOW (ref >60)
Glucose, Bld: 133 mg/dL — ABNORMAL HIGH (ref 65–99)
Potassium: 3.5 mmol/L (ref 3.5–5.1)
Sodium: 132 mmol/L — ABNORMAL LOW (ref 135–145)
Total Bilirubin: 1.1 mg/dL (ref 0.3–1.2)
Total Protein: 8.8 g/dL — ABNORMAL HIGH (ref 6.5–8.1)

## 2014-11-06 LAB — POC OCCULT BLOOD, ED: Fecal Occult Bld: NEGATIVE

## 2014-11-06 LAB — LIPASE, BLOOD: Lipase: 27 U/L (ref 22–51)

## 2014-11-06 MED ORDER — IOHEXOL 300 MG/ML  SOLN
50.0000 mL | Freq: Once | INTRAMUSCULAR | Status: AC | PRN
  Administered 2014-11-06: 50 mL via ORAL

## 2014-11-06 MED ORDER — ACETAMINOPHEN 500 MG PO TABS
1000.0000 mg | ORAL_TABLET | Freq: Once | ORAL | Status: AC
  Administered 2014-11-06: 1000 mg via ORAL
  Filled 2014-11-06: qty 2

## 2014-11-06 MED ORDER — SODIUM CHLORIDE 0.9 % IV BOLUS (SEPSIS)
2000.0000 mL | Freq: Once | INTRAVENOUS | Status: AC
  Administered 2014-11-06: 2000 mL via INTRAVENOUS

## 2014-11-06 MED ORDER — IBUPROFEN 800 MG PO TABS
800.0000 mg | ORAL_TABLET | Freq: Once | ORAL | Status: AC
  Administered 2014-11-06: 800 mg via ORAL
  Filled 2014-11-06: qty 1

## 2014-11-06 MED ORDER — IOHEXOL 300 MG/ML  SOLN
100.0000 mL | Freq: Once | INTRAMUSCULAR | Status: AC | PRN
  Administered 2014-11-06: 100 mL via INTRAVENOUS

## 2014-11-06 NOTE — Discharge Instructions (Signed)
You can take Imodium for diarrhea. Patient to drink plenty fluids. You had a possible adenoma on your right adrenal gland. Follow-up with her doctor to have further imaging studies. You had elevated LFTs. Please ask your doctor to redraw these week. Diarrhea Diarrhea is frequent loose and watery bowel movements. It can cause you to feel weak and dehydrated. Dehydration can cause you to become tired and thirsty, have a dry mouth, and have decreased urination that often is dark yellow. Diarrhea is a sign of another problem, most often an infection that will not last long. In most cases, diarrhea typically lasts 2-3 days. However, it can last longer if it is a sign of something more serious. It is important to treat your diarrhea as directed by your caregiver to lessen or prevent future episodes of diarrhea. CAUSES  Some common causes include:  Gastrointestinal infections caused by viruses, bacteria, or parasites.  Food poisoning or food allergies.  Certain medicines, such as antibiotics, chemotherapy, and laxatives.  Artificial sweeteners and fructose.  Digestive disorders. HOME CARE INSTRUCTIONS  Ensure adequate fluid intake (hydration): Have 1 cup (8 oz) of fluid for each diarrhea episode. Avoid fluids that contain simple sugars or sports drinks, fruit juices, whole milk products, and sodas. Your urine should be clear or pale yellow if you are drinking enough fluids. Hydrate with an oral rehydration solution that you can purchase at pharmacies, retail stores, and online. You can prepare an oral rehydration solution at home by mixing the following ingredients together:   - tsp table salt.   tsp baking soda.   tsp salt substitute containing potassium chloride.  1  tablespoons sugar.  1 L (34 oz) of water.  Certain foods and beverages may increase the speed at which food moves through the gastrointestinal (GI) tract. These foods and beverages should be avoided and include:  Caffeinated  and alcoholic beverages.  High-fiber foods, such as raw fruits and vegetables, nuts, seeds, and whole grain breads and cereals.  Foods and beverages sweetened with sugar alcohols, such as xylitol, sorbitol, and mannitol.  Some foods may be well tolerated and may help thicken stool including:  Starchy foods, such as rice, toast, pasta, low-sugar cereal, oatmeal, grits, baked potatoes, crackers, and bagels.  Bananas.  Applesauce.  Add probiotic-rich foods to help increase healthy bacteria in the GI tract, such as yogurt and fermented milk products.  Wash your hands well after each diarrhea episode.  Only take over-the-counter or prescription medicines as directed by your caregiver.  Take a warm bath to relieve any burning or pain from frequent diarrhea episodes. SEEK IMMEDIATE MEDICAL CARE IF:   You are unable to keep fluids down.  You have persistent vomiting.  You have blood in your stool, or your stools are black and tarry.  You do not urinate in 6-8 hours, or there is only a small amount of very dark urine.  You have abdominal pain that increases or localizes.  You have weakness, dizziness, confusion, or light-headedness.  You have a severe headache.  Your diarrhea gets worse or does not get better.  You have a fever or persistent symptoms for more than 2-3 days.  You have a fever and your symptoms suddenly get worse. MAKE SURE YOU:   Understand these instructions.  Will watch your condition.  Will get help right away if you are not doing well or get worse. Document Released: 02/18/2002 Document Revised: 07/15/2013 Document Reviewed: 11/06/2011 [facility] Patient Information 2015 [place], [place]. This information is not  intended to replace advice given to you by your health care provider. Make sure you discuss any questions you have with your health care provider. ° °

## 2014-11-06 NOTE — ED Notes (Signed)
Diarrhea and lower back pain since Sunday.

## 2014-11-06 NOTE — ED Provider Notes (Addendum)
CSN: [id number]     Arrival date & time 11/06/14  1417 History   First MD Initiated Contact with Patient 11/06/14 1430     Chief Complaint  Patient presents with  . Diarrhea     (Consider location/radiation/quality/duration/timing/severity/associated sxs/prior Treatment) Patient is a 49 y.o. male presenting with diarrhea. The history is provided by the patient.  Diarrhea Quality:  Watery (darkish [place]) Severity:  Moderate Onset quality:  Gradual Duration:  4 days Timing:  Constant Progression:  Unchanged Relieved by:  Nothing Worsened by:  Nothing tried Ineffective treatments:  None tried Associated symptoms: no abdominal pain, no arthralgias, no chills, no fever, no headaches, no myalgias and no vomiting   Risk factors: no recent antibiotic use and no sick contacts    49 yo M with a chief with diarrhea. This been going on for 4 days. Has not changed. Patient is tried Pepto-Bismol Tylenol without relief. Patient has felt nauseated but has had no vomiting. Patient denies any abdominal pain but has some gurgling in his abdomen just oriented to go the bathroom. Patient denies any sick contacts. Patient denies recent antibiotic use. Patient had some subjective fevers a couple days ago but denies current fevers. Patient denies any recent travel denies any suspicious water or food intake.  Past Medical History  Diagnosis Date  . Essential hypertension, benign   . Alcohol abuse   . Hyperlipidemia   . Anxiety    Past Surgical History  Procedure Laterality Date  . Facial reconstruction surgery     Family History  Problem Relation Age of Onset  . Hypertension Mother   . Alcohol abuse Father   . Heart disease Father   . Stroke Father   . Cancer Father    Social History  Substance Use Topics  . Smoking status: Former Smoker -- 10 years    Types: Cigarettes  . Smokeless tobacco: Never Used  . Alcohol Use: Yes     Comment: last drink sunday    Review of Systems   Constitutional: Negative for fever and chills.  HENT: Negative for congestion and facial swelling.   Eyes: Negative for discharge and visual disturbance.  Respiratory: Negative for shortness of breath.   Cardiovascular: Negative for chest pain and palpitations.  Gastrointestinal: Positive for nausea, diarrhea (dark stools) and abdominal distention. Negative for vomiting and abdominal pain.  Musculoskeletal: Negative for myalgias and arthralgias.  Skin: Negative for color change and rash.  Neurological: Negative for tremors, syncope and headaches.  Psychiatric/Behavioral: Negative for confusion and dysphoric mood.      Allergies  Review of patient's allergies indicates no known allergies.  Home Medications   Prior to Admission medications   Medication Sig Start Date End Date Taking? Authorizing Provider  lisinopril-hydrochlorothiazide (PRINZIDE) 20-12.5 MG per tablet Take 1 tablet by mouth daily. 11/27/13 11/27/14 Yes Joanna Puff, MD  metoprolol tartrate (LOPRESSOR) 25 MG tablet Take 25 mg by mouth 2 (two) times daily.   Yes Historical Provider, MD   BP 131/84 mmHg  Pulse 93  Temp(Src) 98 F (36.7 C) (Oral)  Resp 14  Ht 5\' 8"  (1.727 m)  Wt 181 lb (82.101 kg)  BMI 27.53 kg/m2  SpO2 95% Physical Exam  Constitutional: He is oriented to person, place, and time. He appears well-developed and well-nourished.  HENT:  Head: Normocephalic and atraumatic.  Eyes: EOM are normal. Pupils are equal, round, and reactive to light.  Neck: Normal range of motion. Neck supple. No JVD present.  Cardiovascular: Regular rhythm.  Tachycardia present.  Exam reveals no gallop and no friction rub.   No murmur heard. Pulmonary/Chest: No respiratory distress. He has no wheezes. He has no rales.  Abdominal: He exhibits distension. There is no rebound and no guarding.  Musculoskeletal: Normal range of motion.  Neurological: He is alert and oriented to person, place, and time.  Skin: No rash noted.  No pallor.  Psychiatric: He has a normal mood and affect. His behavior is normal.    ED Course  Procedures (including critical care time) Labs Review Labs Reviewed  CBC WITH DIFFERENTIAL/PLATELET - Abnormal; Notable for the following:    WBC 11.3 (*)    Neutro Abs 8.4 (*)    Monocytes Relative 13 (*)    Monocytes Absolute 1.5 (*)    All other components within normal limits  COMPREHENSIVE METABOLIC PANEL - Abnormal; Notable for the following:    Sodium 132 (*)    Chloride 93 (*)    Glucose, Bld 133 (*)    BUN 22 (*)    Creatinine, Ser 1.64 (*)    Calcium 8.7 (*)    Total Protein 8.8 (*)    Albumin 3.1 (*)    AST 120 (*)    ALT 86 (*)    Alkaline Phosphatase 131 (*)    GFR calc non Af Amer 47 (*)    GFR calc Af Amer 55 (*)    All other components within normal limits  LIPASE, BLOOD  HEPATITIS PANEL, ACUTE  POC OCCULT BLOOD, ED    Imaging Review Ct Abdomen Pelvis W Contrast  11/06/2014   CLINICAL DATA:  Low abdomen and low back pain and diarrhea for several days.  EXAM: CT ABDOMEN AND PELVIS WITH CONTRAST  TECHNIQUE: Multidetector CT imaging of the abdomen and pelvis was performed using the standard protocol following bolus administration of intravenous contrast.  CONTRAST:  50mL OMNIPAQUE IOHEXOL 300 MG/ML SOLN, OMNIPAQUE IOHEXOL 300 MG/ML SOLN  COMPARISON:  None.  FINDINGS: Diffuse low density of the liver relative to the spleen. Small right lobe liver cyst. Mild diffuse bladder wall thickening. Normal sized prostate. Normal appearing spleen, pancreas, gallbladder, right adrenal gland, kidneys and ureters. No urinary tract calculi or hydronephrosis. There is a 1.6 cm rounded, medium density mass in the left adrenal gland. No gastrointestinal abnormalities or enlarged lymph nodes. Normal appearing appendix. Mild airspace opacity and volume loss in the lingula. L5-S1 degenerative changes. Very small umbilical hernia containing fat.  IMPRESSION: 1. 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 This could be confirmed with a noncontrast CT or MR examination of the abdomen. 2. Lingular atelectasis and possible pneumonia. 3. Diffuse hepatic steatosis. 4. Mild diffuse bladder wall thickening. This could be due to to cystitis. 5. Very small umbilical hernia containing fat.   Electronically Signed   By: Beckie Salts M.D.   On: 11/06/2014 17:37   I have personally reviewed and evaluated these images and lab results as part of my medical decision-making.   EKG Interpretation   Date/Time:  Thursday November 06 2014 14:59:22 EDT Ventricular Rate:  115 PR Interval:  127 QRS Duration: 89 QT Interval:  320 QTC Calculation: 443 R Axis:   71 Text Interpretation:  Sinus tachycardia No significant change since last  tracing Confirmed by Yumiko Alkins MD, Reuel Boom ([id number]) on 11/06/2014 3:04:55 PM      MDM   Final diagnoses:  Diarrhea    48 yo with a chief complaint diarrhea. Patient's heart rate 125 on arrival. Concern for significant  dehydration versus electrolyte abnormality will give fluid bolus check electrode CBC. Patient states that his stools were dark rectal exam without gross blood [place] stool heme-negative.  Leukocytosis and elevated LFTs. Febrile on vital sign recheck. Will obtain CT scan abdomen and pelvis.  Tachycardia resolved with IV fluids. Fever improved with Tylenol and Motrin. Patient's back pain improved as well. CT scan with no significant acute findings. Question about pneumonia, questionable bladder wall thickening. Patient does not endorse any symptoms supportive of pneumonia or UTI.  Possible adrenal adenoma discussed with patient.  Patient requesting discharge will follow with his PCP in one week for recheck.  7:00 PM:  I have discussed the diagnosis/risks/treatment options with the patient and family and believe the pt to be eligible for discharge home to follow-up with PCP. We also discussed returning to the ED immediately if new or worsening sx occur. We discussed  the sx which are most concerning (e.g., worsening symptoms) that necessitate immediate return. Medications administered to the patient during their visit and any new prescriptions provided to the patient are listed below.  Medications given during this visit Medications  sodium chloride 0.9 % bolus 2,000 mL (2,000 mLs Intravenous New Bag/Given 11/06/14 1500)  acetaminophen (TYLENOL) tablet 1,000 mg (1,000 mg Oral Given 11/06/14 1459)  ibuprofen (ADVIL,MOTRIN) tablet 800 mg (800 mg Oral Given 11/06/14 1502)  iohexol (OMNIPAQUE) 300 MG/ML solution 50 mL (50 mLs Oral Contrast Given 11/06/14 1612)  iohexol (OMNIPAQUE) 300 MG/ML solution 100 mL (100 mLs Intravenous Contrast Given 11/06/14 1720)    New Prescriptions   No medications on file     The patient appears reasonably screen and/or stabilized for discharge and I doubt any other medical condition or other [facility] requiring further screening, evaluation, or treatment in the ED at this time prior to discharge.    Melene Plan, DO 11/06/14 1901  Melene Plan, DO 11/06/14 1914

## 2014-11-07 LAB — HEPATITIS PANEL, ACUTE
HCV Ab: <0.1 s/co ratio (ref 0.0–0.9)
Hep A IgM: NEGATIVE
Hep B C IgM: NEGATIVE
Hepatitis B Surface Ag: NEGATIVE

## 2014-12-10 ENCOUNTER — Other Ambulatory Visit: Payer: Self-pay | Admitting: Physician Assistant

## 2014-12-10 LAB — LIPID PANEL
Cholesterol: 239 mg/dL — ABNORMAL HIGH (ref 125–200)
HDL: 60 mg/dL (ref >=40)
LDL Cholesterol: 164 mg/dL — ABNORMAL HIGH (ref <130)
Total CHOL/HDL Ratio: 4 Ratio (ref <=5.0)
Triglycerides: 74 mg/dL (ref <150)
VLDL: 15 mg/dL (ref <30)

## 2014-12-10 LAB — COMPREHENSIVE METABOLIC PANEL
ALT: 23 U/L (ref 9–46)
AST: 21 U/L (ref 10–35)
Albumin: 4 g/dL (ref 3.6–5.1)
Alkaline Phosphatase: 79 U/L (ref 40–115)
BUN: 13 mg/dL (ref 7–25)
CO2: 28 mmol/L (ref 20–31)
Calcium: 9.5 mg/dL (ref 8.6–10.3)
Chloride: 99 mmol/L (ref 98–110)
Creat: 0.89 mg/dL (ref 0.70–1.33)
Glucose, Bld: 95 mg/dL (ref 65–99)
Potassium: 4 mmol/L (ref 3.5–5.3)
Sodium: 136 mmol/L (ref 135–146)
Total Bilirubin: 0.5 mg/dL (ref 0.2–1.2)
Total Protein: 7.3 g/dL (ref 6.1–8.1)

## 2015-04-04 ENCOUNTER — Encounter (HOSPITAL_COMMUNITY): Payer: Self-pay | Admitting: Cardiology

## 2015-04-04 ENCOUNTER — Emergency Department (HOSPITAL_COMMUNITY)
Admission: EM | Admit: 2015-04-04 | Discharge: 2015-04-04 | Disposition: A | Discharge status: Home / Self Care | Payer: Self-pay | Attending: Emergency Medicine | Admitting: Emergency Medicine

## 2015-04-04 ENCOUNTER — Emergency Department (HOSPITAL_COMMUNITY): Payer: Self-pay

## 2015-04-04 DIAGNOSIS — Z79899 Other long term (current) drug therapy: Secondary | ICD-10-CM | POA: Insufficient documentation

## 2015-04-04 DIAGNOSIS — Z87891 Personal history of nicotine dependence: Secondary | ICD-10-CM | POA: Insufficient documentation

## 2015-04-04 DIAGNOSIS — M791 Myalgia: Secondary | ICD-10-CM | POA: Insufficient documentation

## 2015-04-04 DIAGNOSIS — K0381 Cracked tooth: Secondary | ICD-10-CM | POA: Insufficient documentation

## 2015-04-04 DIAGNOSIS — Z8659 Personal history of other mental and behavioral disorders: Secondary | ICD-10-CM | POA: Insufficient documentation

## 2015-04-04 DIAGNOSIS — I10 Essential (primary) hypertension: Secondary | ICD-10-CM | POA: Insufficient documentation

## 2015-04-04 DIAGNOSIS — K0889 Other specified disorders of teeth and supporting structures: Secondary | ICD-10-CM | POA: Insufficient documentation

## 2015-04-04 DIAGNOSIS — Z8639 Personal history of other endocrine, nutritional and metabolic disease: Secondary | ICD-10-CM | POA: Insufficient documentation

## 2015-04-04 DIAGNOSIS — M25561 Pain in right knee: Secondary | ICD-10-CM | POA: Insufficient documentation

## 2015-04-04 MED ORDER — NAPROXEN 500 MG PO TABS: 500.0000 mg | ORAL_TABLET | Freq: Two times a day (BID) | ORAL | Status: DC

## 2015-04-04 MED ORDER — PENICILLIN V POTASSIUM 500 MG PO TABS: 500.0000 mg | ORAL_TABLET | Freq: Four times a day (QID) | ORAL | Status: AC

## 2015-04-04 NOTE — Discharge Instructions (Signed)
Dental Pain It is very important that you follow up with a dentist. Return to the ED if you develop difficulty breathing, difficulty swallowing, or any other concerns. Dental pain may be caused by many things, including:  Tooth decay (cavities or caries). Cavities expose the nerve of your tooth to air and hot or cold temperatures. This can cause pain or discomfort.  Abscess or infection. A dental abscess is a collection of infected pus from a bacterial infection in the inner part of the tooth (pulp). It usually occurs at the end of the tooth's root.  Injury.  An unknown reason (idiopathic). Your pain may be mild or severe. It may only occur when:  You are chewing.  You are exposed to hot or cold temperature.  You are eating or drinking sugary foods or beverages, such as soda or candy. Your pain may also be constant. HOME CARE INSTRUCTIONS Watch your dental pain for any changes. The following actions may help to lessen any discomfort that you are feeling:  Take medicines only as directed by your dentist.  If you were prescribed an antibiotic medicine, finish all of it even if you start to feel better.  Keep all follow-up visits as directed by your dentist. This is important.  Do not apply heat to the outside of your face.  Rinse your mouth or gargle with salt water if directed by your dentist. This helps with pain and swelling.  You can make salt water by adding  tsp of salt to 1 cup of warm water.  Apply ice to the painful area of your face:  Put ice in a plastic bag.  Place a towel between your skin and the bag.  Leave the ice on for 20 minutes, 2-3 times per day.  Avoid foods or drinks that cause you pain, such as:  Very hot or very cold foods or drinks.  Sweet or sugary foods or drinks. SEEK MEDICAL CARE IF:  Your pain is not controlled with medicines.  Your symptoms are worse.  You have new symptoms. SEEK IMMEDIATE MEDICAL CARE IF:  You are unable to open  your mouth.  You are having trouble breathing or swallowing.  You have a fever.  Your face, neck, or jaw is swollen.   This information is not intended to replace advice given to you by your health care provider. Make sure you discuss any questions you have with your health care provider.   Document Released: 02/28/2005 Document Revised: 07/15/2014 Document Reviewed: 02/24/2014 Else[employer] Interactive Patient Education [employer]

## 2015-04-04 NOTE — ED Provider Notes (Signed)
CSN: [id number]     Arrival date & time 04/04/15  2841 History   First MD Initiated Contact with Patient 04/04/15 [phone]     Chief Complaint  Patient presents with  . Dental Pain  . Knee Pain     (Consider location/radiation/quality/duration/timing/severity/associated sxs/prior Treatment) HPI Comments: Patient reports 3 days of right lower dental pain and several days of right knee pain. States both problems; going on "for a while". Dental pain worse since yesterday. No difficulty breathing or swallowing. No fever. No chest pain or shortness of breath. He does not have a dentist. Reports injuring his knee several years ago and believes he has arthritis.  Patient is a 50 y.o. male presenting with tooth pain and knee pain. The history is provided by the patient.  Dental Pain Associated symptoms: no congestion, no fever, no headaches and no neck pain   Knee Pain Associated symptoms: no back pain, no fever and no neck pain     Past Medical History  Diagnosis Date  . Essential hypertension, benign   . Alcohol abuse   . Hyperlipidemia   . Anxiety    Past Surgical History  Procedure Laterality Date  . Facial reconstruction surgery     Family History  Problem Relation Age of Onset  . Hypertension Mother   . Alcohol abuse Father   . Heart disease Father   . Stroke Father   . Cancer Father    Social History  Substance Use Topics  . Smoking status: Former Smoker -- 10 years    Types: Cigarettes  . Smokeless tobacco: Never Used  . Alcohol Use: Yes     Comment: occasional     Review of Systems  Constitutional: Negative for fever, activity change and appetite change.  HENT: Negative for congestion and voice change.   Eyes: Negative for visual disturbance.  Respiratory: Negative for cough, chest tightness and shortness of breath.   Cardiovascular: Negative for chest pain and palpitations.  Gastrointestinal: Negative for nausea, vomiting and abdominal pain.  Genitourinary:  Negative for dysuria, hematuria, decreased urine volume and scrotal swelling.  Musculoskeletal: Positive for myalgias and arthralgias. Negative for back pain and neck pain.  Skin: Negative for wound.  Neurological: Negative for dizziness, weakness, light-headedness and headaches.  A complete 10 system review of systems was obtained and all systems are negative except as noted in the HPI and PMH.      Allergies  Review of patient's allergies indicates no known allergies.  Home Medications   Prior to Admission medications   Medication Sig Start Date End Date Taking? Authorizing Provider  lisinopril-hydrochlorothiazide (PRINZIDE) 20-12.5 MG per tablet Take 1 tablet by mouth daily. 11/27/13 04/04/15 Yes Joanna Puff, MD  metoprolol tartrate (LOPRESSOR) 25 MG tablet Take 25 mg by mouth 2 (two) times daily.   Yes Historical Provider, MD  naproxen (NAPROSYN) 500 MG tablet Take 1 tablet (500 mg total) by mouth 2 (two) times daily. 04/04/15   [name], MD  penicillin v potassium (VEETID) 500 MG tablet Take 1 tablet (500 mg total) by mouth 4 (four) times daily. 04/04/15 04/11/15  [name], MD   BP 158/93 mmHg  Pulse 88  Temp(Src) 98 F (36.7 C) (Oral)  Resp 16  Ht  (1.727 m)  Wt 181 lb (82.101 kg)  BMI 27.53 kg/m2  SpO2 94% Physical Exam  Constitutional: He is oriented to person, place, and time. He appears well-developed and well-nourished. No distress.  HENT:  Head: Normocephalic and atraumatic.  Mouth/Throat: Oropharynx is clear and moist. No oropharyngeal exudate.  Poor dentition throughout. Multiple teeth broken off at the gumline. There is tenderness to percussion over the right lower molar is also broken off at the gumline. Floor of mouth is soft. There is no appreciable abscess or areas of fluctuance.  Eyes: Conjunctivae and EOM are normal. Pupils are equal, round, and reactive to light.  Neck: Normal range of motion. Neck supple.  No meningismus.   Cardiovascular: Normal rate, regular rhythm, normal heart sounds and intact distal pulses.   No murmur heard. Pulmonary/Chest: Effort normal and breath sounds normal. No respiratory distress.  Abdominal: Soft. There is no tenderness. There is no rebound and no guarding.  Musculoskeletal: Normal range of motion. He exhibits no edema or tenderness.  No effusion. Flexion and extension intact. No erythema.  DP and PT pulses intact.  TTP R anterior knee  Neurological: He is alert and oriented to person, place, and time. No cranial nerve deficit. He exhibits normal muscle tone. Coordination normal.  No ataxia on finger to nose bilaterally. No pronator drift. 5/5 strength throughout. CN 2-12 intact.Equal grip strength. Sensation intact.   Skin: Skin is warm.  Psychiatric: He has a normal mood and affect. His behavior is normal.  Nursing note and vitals reviewed.   ED Course  Procedures (including critical care time) Labs Review Labs Reviewed - No data to display  Imaging Review Dg Knee Complete 4 Views Right  04/04/2015  CLINICAL DATA:  Right knee pain following twisting injury, initial encounter EXAM: RIGHT KNEE - COMPLETE 4+ VIEW COMPARISON:  None. FINDINGS: There is no evidence of fracture, dislocation, or joint effusion. There is no evidence of arthropathy or other focal bone abnormality. Soft tissues are unremarkable. Mild spurring is noted from the superior aspect of the patella. IMPRESSION: Mild degenerative changes without acute abnormality. Electronically Signed   By: Alcide Clever M.D.   On: 04/04/2015 09:31   I have personally reviewed and evaluated these images and lab results as part of my medical decision-making.   EKG Interpretation None      MDM   Final diagnoses:  Pain, dental   dental pain, no evidence of ludwig's angina or abscess. No fever. Poor dentition throughout with multiple teeth broken off at the gumline.  Patient will be given penicillin, follow-up with  dentistry, resources provided. Knee x-ray is negative. Return to the ED with worsening symptoms including difficulty swallowing or difficulty breathing.     [name], MD 04/04/15 [phone]

## 2015-04-04 NOTE — ED Notes (Signed)
Dental pain and right knee pain for a while.  Dental pain worse since yesterday.  Swelling to right side of face.

## 2015-06-10 ENCOUNTER — Ambulatory Visit: Payer: Self-pay | Admitting: Physician Assistant

## 2015-06-15 ENCOUNTER — Encounter: Payer: Self-pay | Admitting: Physician Assistant

## 2015-06-16 ENCOUNTER — Encounter: Payer: Self-pay | Admitting: Physician Assistant

## 2015-06-16 ENCOUNTER — Other Ambulatory Visit: Payer: Self-pay | Admitting: Physician Assistant

## 2015-06-16 ENCOUNTER — Ambulatory Visit: Payer: Self-pay | Admitting: Physician Assistant

## 2015-06-16 VITALS — BP 116/76 | HR 87 | Temp 97.3°F | Ht 68.0 in | Wt 175.2 lb

## 2015-06-16 DIAGNOSIS — I10 Essential (primary) hypertension: Secondary | ICD-10-CM

## 2015-06-16 DIAGNOSIS — E785 Hyperlipidemia, unspecified: Secondary | ICD-10-CM

## 2015-06-16 LAB — COMPREHENSIVE METABOLIC PANEL
ALT: 26 U/L (ref 9–46)
AST: 28 U/L (ref 10–35)
Albumin: 4.4 g/dL (ref 3.6–5.1)
Alkaline Phosphatase: 74 U/L (ref 40–115)
BUN: 14 mg/dL (ref 7–25)
CO2: 22 mmol/L (ref 20–31)
Calcium: 9 mg/dL (ref 8.6–10.3)
Chloride: 102 mmol/L (ref 98–110)
Creat: 0.95 mg/dL (ref 0.70–1.33)
Glucose, Bld: 93 mg/dL (ref 65–99)
Potassium: 4.3 mmol/L (ref 3.5–5.3)
Sodium: 139 mmol/L (ref 135–146)
Total Bilirubin: 0.3 mg/dL (ref 0.2–1.2)
Total Protein: 7.1 g/dL (ref 6.1–8.1)

## 2015-06-16 LAB — CBC
HCT: 40.7 % (ref 38.5–50.0)
Hemoglobin: 14.3 g/dL (ref 13.2–17.1)
MCH: 31.5 pg (ref 27.0–33.0)
MCHC: 35.1 g/dL (ref 32.0–36.0)
MCV: 89.6 fL (ref 80.0–100.0)
MPV: 9.5 fL (ref 7.5–12.5)
Platelets: 287 10*3/uL (ref 140–400)
RBC: 4.54 MIL/uL (ref 4.20–5.80)
RDW: 13.5 % (ref 11.0–15.0)
WBC: 4.7 10*3/uL (ref 3.8–10.8)

## 2015-06-16 LAB — LIPID PANEL
Cholesterol: 224 mg/dL — ABNORMAL HIGH (ref 125–200)
HDL: 63 mg/dL (ref >=40)
LDL Cholesterol: 136 mg/dL — ABNORMAL HIGH (ref <130)
Total CHOL/HDL Ratio: 3.6 Ratio (ref <=5.0)
Triglycerides: 127 mg/dL (ref <150)
VLDL: 25 mg/dL (ref <30)

## 2015-06-16 NOTE — Patient Instructions (Signed)
Get blood drawn BRING ALL MEDS TO EVERY APPOINTMENT

## 2015-06-16 NOTE — Progress Notes (Signed)
BP 116/76 mmHg  Pulse 87  Temp(Src) 97.3 F (36.3 C)  Ht 5\' 8"  (1.727 m)  Wt 175 lb 3.2 oz (79.47 kg)  BMI 26.65 kg/m2  SpO2 99%   Subjective:    Patient ID: George Schroeder, male    DOB: 05/16/1965, 50 y.o.   MRN: [id number]  HPI: George HawkingWillie P Robitaille is a 50 y.o. male presenting on 06/16/2015 for Hypertension and Hyperlipidemia   HPI   Pt is working at [employer] place now.  He says it is just a temporary job.    He did not get his labs drawn.    Pt is going to [facility] for his anxiety.  They are giving him "cymbolix"  Relevant past medical, surgical, family and social history reviewed and updated as indicated. Interim medical history since our last visit reviewed. Allergies and medications reviewed and updated.  CURRENT MEDS: "That blood pressure medicine and that cholesterol medicine"- taking 2 different pills a day.  At last OV he was taking 2 different bp meds, a statin and citalopram.    When asked specifically, he says no, he isn't taking 2 meds, he is taking 3- "that anxiety pill".  He has no idea what specific meds he is taking.  Review of Systems  Constitutional: Negative for fever, chills, diaphoresis, appetite change, fatigue and unexpected weight change.  HENT: Positive for sneezing. Negative for congestion, dental problem, drooling, ear pain, facial swelling, hearing loss, mouth sores, sore throat, trouble swallowing and voice change.   Eyes: Negative for pain, discharge, redness, itching and visual disturbance.  Respiratory: Positive for cough and shortness of breath. Negative for choking and wheezing.   Cardiovascular: Negative for chest pain, palpitations and leg swelling.  Gastrointestinal: Negative for vomiting, abdominal pain, diarrhea, constipation and blood in stool.  Endocrine: Negative for cold intolerance, heat intolerance and polydipsia.  Genitourinary: Negative for dysuria, hematuria and decreased urine volume.  Musculoskeletal: Positive for back pain.  Negative for arthralgias and gait problem.  Skin: Negative for rash.  Allergic/Immunologic: Positive for environmental allergies.  Neurological: Negative for seizures, syncope, light-headedness and headaches.  Hematological: Negative for adenopathy.  Psychiatric/Behavioral: Positive for dysphoric mood. Negative for suicidal ideas and agitation. The patient is nervous/anxious.     Per HPI unless specifically indicated above     Objective:    BP 116/76 mmHg  Pulse 87  Temp(Src) 97.3 F (36.3 C)  Ht 5\' 8"  (1.727 m)  Wt 175 lb 3.2 oz (79.47 kg)  BMI 26.65 kg/m2  SpO2 99%  Wt Readings from Last 3 Encounters:  06/16/15 175 lb 3.2 oz (79.47 kg)  04/04/15 181 lb (82.101 kg)  11/06/14 181 lb (82.101 kg)    Physical Exam  Constitutional: He is oriented to person, place, and time. He appears well-developed and well-nourished.  HENT:  Head: Normocephalic and atraumatic.  Neck: Neck supple.  Cardiovascular: Normal rate and regular rhythm.   Pulmonary/Chest: Effort normal and breath sounds normal. He has no wheezes.  Abdominal: Soft. Bowel sounds are normal. There is no hepatosplenomegaly. There is no tenderness.  Musculoskeletal: He exhibits no edema.  Lymphadenopathy:    He has no cervical adenopathy.  Neurological: He is alert and oriented to person, place, and time.  Skin: Skin is warm and dry.  Psychiatric: He has a normal mood and affect. His behavior is normal.  Vitals reviewed.       Assessment & Plan:    Encounter Diagnoses  Name Primary?  . Essential hypertension, benign  Yes  . Hyperlipidemia     -pt counseled to get labs drawn that were ordered (faxed)- cmp, lipids, cbc,psa  -pt to bring meds with him to next appt -will discuss ED meds after reviewing current meds and labs -f/u 1 month

## 2015-07-16 ENCOUNTER — Ambulatory Visit: Payer: Self-pay | Admitting: Physician Assistant

## 2015-07-20 ENCOUNTER — Encounter: Payer: Self-pay | Admitting: Physician Assistant

## 2015-07-30 ENCOUNTER — Emergency Department (HOSPITAL_COMMUNITY)
Admission: EM | Admit: 2015-07-30 | Discharge: 2015-07-30 | Disposition: A | Discharge status: Home / Self Care | Payer: Self-pay | Attending: Emergency Medicine | Admitting: Emergency Medicine

## 2015-07-30 ENCOUNTER — Encounter (HOSPITAL_COMMUNITY): Payer: Self-pay | Admitting: Emergency Medicine

## 2015-07-30 DIAGNOSIS — I10 Essential (primary) hypertension: Secondary | ICD-10-CM | POA: Insufficient documentation

## 2015-07-30 DIAGNOSIS — K029 Dental caries, unspecified: Secondary | ICD-10-CM | POA: Insufficient documentation

## 2015-07-30 DIAGNOSIS — Z87891 Personal history of nicotine dependence: Secondary | ICD-10-CM | POA: Insufficient documentation

## 2015-07-30 DIAGNOSIS — K0889 Other specified disorders of teeth and supporting structures: Secondary | ICD-10-CM

## 2015-07-30 DIAGNOSIS — E785 Hyperlipidemia, unspecified: Secondary | ICD-10-CM | POA: Insufficient documentation

## 2015-07-30 MED ORDER — CLINDAMYCIN HCL 150 MG PO CAPS: 300.0000 mg | ORAL_CAPSULE | Freq: Four times a day (QID) | ORAL | Status: DC

## 2015-07-30 MED ORDER — DICLOFENAC SODIUM 75 MG PO TBEC: 75.0000 mg | DELAYED_RELEASE_TABLET | Freq: Two times a day (BID) | ORAL | Status: DC

## 2015-07-30 NOTE — ED Provider Notes (Signed)
CSN: [id number]     Arrival date & time 07/30/15  1155 History   First MD Initiated Contact with Patient 07/30/15 1303     Chief Complaint  Patient presents with  . Dental Pain     (Consider location/radiation/quality/duration/timing/severity/associated sxs/prior Treatment) HPI   George Schroeder is a 50 y.o. male who presents to the Emergency Department complaining of dental pain for 2 days.  He states that he has several broken teeth and cavities with intermittent pain to his teeth.  Pain increased recently with mild swelling of his left face. He has not taken any medications for symptom relief.   He denies fever, chills, neck pain, difficulty swallowing or opening and closing his mouth.   Past Medical History  Diagnosis Date  . Essential hypertension, benign   . Alcohol abuse   . Hyperlipidemia   . Anxiety   . TBI (traumatic brain injury) [facility](HCC) age 50   Past Surgical History  Procedure Laterality Date  . Facial reconstruction surgery     Family History  Problem Relation Age of Onset  . Hypertension Mother   . Alcohol abuse Father   . Heart disease Father   . Stroke Father   . Cancer Father    Social History  Substance Use Topics  . Smoking status: Former Smoker -- 10 years    Types: Cigarettes    Quit date: 03/14/1993  . Smokeless tobacco: Never Used  . Alcohol Use: Yes     Comment: occasional - hx alcoholism    Review of Systems  Constitutional: Negative for fever and appetite change.  HENT: Positive for dental problem. Negative for congestion, facial swelling, sore throat and trouble swallowing.   Eyes: Negative for pain and visual disturbance.  Musculoskeletal: Negative for neck pain and neck stiffness.  Neurological: Negative for dizziness, facial asymmetry and headaches.  Hematological: Negative for adenopathy.  All other systems reviewed and are negative.     Allergies  Review of patient's allergies indicates no known allergies.  Home Medications    Prior to Admission medications   Medication Sig Start Date End Date Taking? Authorizing Provider  lisinopril-hydrochlorothiazide (PRINZIDE) 20-12.5 MG per tablet Take 1 tablet by mouth daily. Patient not taking: Reported on 06/16/2015 11/27/13 06/16/15  Joanna Puffrystal S Dorsey, MD  metoprolol tartrate (LOPRESSOR) 25 MG tablet Take 25 mg by mouth 2 (two) times daily. Reported on 06/16/2015    Historical Provider, MD  naproxen (NAPROSYN) 500 MG tablet Take 1 tablet (500 mg total) by mouth 2 (two) times daily. Patient not taking: Reported on 06/16/2015 04/04/15   Glynn OctaveStephen Rancour, MD   BP 151/104 mmHg  Pulse 83  Temp(Src) 98.3 F (36.8 C) (Oral)  Resp 16  Ht 5\' 8"  (1.727 m)  Wt 81.647 kg  BMI 27.38 kg/m2  SpO2 100% Physical Exam  Constitutional: He is oriented to person, place, and time. He appears well-developed and well-nourished. No distress.  HENT:  Head: Normocephalic and atraumatic.  Right Ear: Tympanic membrane and ear canal normal.  Left Ear: Tympanic membrane and ear canal normal.  Mouth/Throat: Uvula is midline, oropharynx is clear and moist and mucous membranes are normal. No trismus in the jaw. Dental caries present. No dental abscesses or uvula swelling.  Tenderness of left lower premolar, multiple dental caries and poor dental hygiene.  No facial swelling, obvious dental abscess, trismus, or sublingual abnml.    Neck: Normal range of motion. Neck supple.  Cardiovascular: Normal rate, regular rhythm and normal heart sounds.  No murmur heard. Pulmonary/Chest: Effort normal and breath sounds normal. No respiratory distress.  Musculoskeletal: Normal range of motion.  Lymphadenopathy:    He has no cervical adenopathy.  Neurological: He is alert and oriented to person, place, and time. He exhibits normal muscle tone. Coordination normal.  Skin: Skin is warm and dry.  Nursing note and vitals reviewed.   ED Course  Procedures (including critical care time) Labs Review Labs Reviewed - No  data to display  Imaging Review No results found. I have personally reviewed and evaluated these images and lab results as part of my medical decision-making.   EKG Interpretation None      MDM   Final diagnoses:  Pain, dental    Pt well appearing.  HTN today, but admits he did not take his BP meds today.    No concerning sx's for Ludwig's angina.  No dental abscess.  Agrees to clinda, diclofenac and to take BP meds when he returns home. Dental referral info given    Pauline Aus, Cordelia Poche 08/02/15 1952  Marily Memos, MD 08/04/15 [phone]

## 2015-07-30 NOTE — ED Notes (Signed)
PT c/o left lower dental pain x2 days with some swelling to left side of face. PT states a tooth is loose to left lower gum.

## 2015-09-23 ENCOUNTER — Encounter: Payer: Self-pay | Admitting: Physician Assistant

## 2015-09-29 ENCOUNTER — Ambulatory Visit: Payer: Self-pay | Admitting: Physician Assistant

## 2015-09-29 ENCOUNTER — Encounter: Payer: Self-pay | Admitting: Physician Assistant

## 2015-09-29 VITALS — BP 100/60 | HR 93 | Temp 97.7°F | Ht 68.0 in | Wt 171.0 lb

## 2015-09-29 DIAGNOSIS — I10 Essential (primary) hypertension: Secondary | ICD-10-CM

## 2015-09-29 DIAGNOSIS — E785 Hyperlipidemia, unspecified: Secondary | ICD-10-CM

## 2015-09-29 NOTE — Progress Notes (Signed)
   BP 100/60 mmHg  Pulse 93  Temp(Src) 97.7 F (36.5 C)  Ht 5\' 8"  (1.727 m)  Wt 171 lb (77.565 kg)  BMI 26.01 kg/m2  SpO2 95%   Subjective:    Patient ID: George Schroeder, male    DOB: 06/03/1965, 50 y.o.   MRN: [id number]  HPI: George Schroeder is a 50 y.o. male presenting on 09/29/2015 for Follow-up   HPI   Chief Complaint  Patient presents with  . Follow-up    has not has lab drawn, and didn't bring meds     Pt did not bring his meds and he doesn't know what he takes.    He is still working at [employer]. He sometimes works at other sites also.  Pt is not going anywhere for counseling.  He says he "sometimes has a little touble with depression"  Relevant past medical, surgical, family and social history reviewed and updated as indicated. Interim medical history since our last visit reviewed. Allergies and medications reviewed and updated.  CURRENT MEDS: unknown  Review of Systems  Constitutional: Negative for fever, chills, diaphoresis, appetite change, fatigue and unexpected weight change.  HENT: Negative for congestion, dental problem, drooling, ear pain, facial swelling, hearing loss, mouth sores, sneezing, sore throat, trouble swallowing and voice change.   Eyes: Negative for pain, discharge, redness, itching and visual disturbance.  Respiratory: Negative for cough, choking, shortness of breath and wheezing.   Cardiovascular: Negative for chest pain, palpitations and leg swelling.  Gastrointestinal: Negative for vomiting, abdominal pain, diarrhea, constipation and blood in stool.  Endocrine: Negative for cold intolerance, heat intolerance and polydipsia.  Genitourinary: Negative for dysuria, hematuria and decreased urine volume.  Musculoskeletal: Negative for back pain, arthralgias and gait problem.  Skin: Negative for rash.  Allergic/Immunologic: Negative for environmental allergies.  Neurological: Negative for seizures, syncope, light-headedness and  headaches.  Hematological: Negative for adenopathy.  Psychiatric/Behavioral: Negative for suicidal ideas, dysphoric mood and agitation. The patient is not nervous/anxious.     Per HPI unless specifically indicated above     Objective:    BP 100/60 mmHg  Pulse 93  Temp(Src) 97.7 F (36.5 C)  Ht 5\' 8"  (1.727 m)  Wt 171 lb (77.565 kg)  BMI 26.01 kg/m2  SpO2 95%  Wt Readings from Last 3 Encounters:  09/29/15 171 lb (77.565 kg)  07/30/15 180 lb (81.647 kg)  06/16/15 175 lb 3.2 oz (79.47 kg)    Physical Exam  Constitutional: He is oriented to person, place, and time. He appears well-developed and well-nourished.  HENT:  Head: Normocephalic and atraumatic.  Neck: Neck supple.  Cardiovascular: Normal rate and regular rhythm.   Pulmonary/Chest: Effort normal and breath sounds normal. He has no wheezes.  Abdominal: Soft. Bowel sounds are normal. There is no hepatosplenomegaly. There is no tenderness.  Musculoskeletal: He exhibits no edema.  Lymphadenopathy:    He has no cervical adenopathy.  Neurological: He is alert and oriented to person, place, and time.  Skin: Skin is warm and dry.  Psychiatric: He has a normal mood and affect. His behavior is normal.  Vitals reviewed.       Assessment & Plan:   Encounter Diagnoses  Name Primary?  . Essential hypertension, benign Yes  . Hyperlipidemia     -pt to Get fasting labs drawn this week.  - F/u one month with medications

## 2015-10-18 ENCOUNTER — Encounter (HOSPITAL_COMMUNITY): Payer: Self-pay | Admitting: Emergency Medicine

## 2015-10-18 ENCOUNTER — Emergency Department (HOSPITAL_COMMUNITY)
Admission: EM | Admit: 2015-10-18 | Discharge: 2015-10-18 | Disposition: A | Discharge status: Home / Self Care | Payer: Self-pay | Attending: Emergency Medicine | Admitting: Emergency Medicine

## 2015-10-18 DIAGNOSIS — Z87891 Personal history of nicotine dependence: Secondary | ICD-10-CM | POA: Insufficient documentation

## 2015-10-18 DIAGNOSIS — I10 Essential (primary) hypertension: Secondary | ICD-10-CM | POA: Insufficient documentation

## 2015-10-18 DIAGNOSIS — K0889 Other specified disorders of teeth and supporting structures: Secondary | ICD-10-CM

## 2015-10-18 DIAGNOSIS — K029 Dental caries, unspecified: Secondary | ICD-10-CM | POA: Insufficient documentation

## 2015-10-18 MED ORDER — CLINDAMYCIN HCL 150 MG PO CAPS: 300.0000 mg | ORAL_CAPSULE | Freq: Four times a day (QID) | ORAL | 0 refills | Status: DC

## 2015-10-18 MED ORDER — DICLOFENAC SODIUM 75 MG PO TBEC
75.0000 mg | DELAYED_RELEASE_TABLET | Freq: Two times a day (BID) | ORAL | 0 refills | Status: DC
Start: 2015-10-18 — End: 2015-11-10

## 2015-10-18 NOTE — ED Triage Notes (Signed)
Dental pain for last 2 days, rates pain 5/10.

## 2015-10-18 NOTE — Discharge Instructions (Signed)
Follow-up with a dentist soon.   °

## 2015-10-18 NOTE — ED Provider Notes (Signed)
[facility]-EMER[facility]Y DEPT Provider Note   CSN: [id number] Arrival date & time: 10/18/15  [id number]  First Provider Contact:  First MD Initiated Contact with Patient 10/18/15 0800        History   Chief Complaint Chief Complaint  Patient presents with  . Dental Pain    left lower    HPI George Schroeder is a 50 y.o. male.  HPI  George Schroeder is a 50 y.o. male who presents to the Emergency Department complaining of left lower dental pain for 2 days.  He states this is a recurrent problem that began "years ago" after one of his teeth broke.  He reports a constant pain to the left lower teeth that radiates to his face and left ear and feels as though his left face is swelling.  Pain is worse with chewing.  He has tried OTC analgesics without relief.  He denies fever, neck pain, chills, and difficulty swallowing.  He has not contacted a Education officer, communitydentist.     Past Medical History:  Diagnosis Date  . Alcohol abuse   . Anxiety   . Essential hypertension, benign   . Hyperlipidemia   . TBI (traumatic brain injury) [facility](HCC) age 50    Patient Active Problem List   Diagnosis Date Noted  . Cardiac murmur 07/10/2013  . Sinus tachycardia (HCC) 07/10/2013  . Screening for prostate cancer 05/02/2012  . Atypical chest pain 03/12/2012  . Left foot pain 03/12/2012  . Onychomycosis 01/01/2012  . Low back pain 10/22/2011  . Traumatic partial tear of right biceps tendon 04/26/2011  . Alcohol abuse 12/14/2010  . 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 Hyperlipidemia 06/25/2009  . OTHER NONSPECIFIC ABNORMAL SERUM ENZYME LEVELS 06/25/2009  . 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 HYPERTENSION, BENIGN SYSTEMIC 05/11/2006  . BPH 05/11/2006    Past Surgical History:  Procedure Laterality Date  . FACIAL RECONSTRUCTION SURGERY         Home Medications    Prior to Admission medications   Medication Sig Start Date End Date Taking? Authorizing Provider  clindamycin (CLEOCIN) 150 MG capsule Take 2  capsules (300 mg total) by mouth 4 (four) times daily. For 7 days 07/30/15   Callan Yontz, PA-C  diclofenac (VOLTAREN) 75 MG EC tablet Take 1 tablet (75 mg total) by mouth 2 (two) times daily. Take with food 07/30/15   Arneda Sappington, PA-C  lisinopril-hydrochlorothiazide (PRINZIDE) 20-12.5 MG per tablet Take 1 tablet by mouth daily. Patient not taking: Reported on 06/16/2015 11/27/13 06/16/15  Joanna Puffrystal S Dorsey, MD  metoprolol tartrate (LOPRESSOR) 25 MG tablet Take 25 mg by mouth 2 (two) times daily. Reported on 06/16/2015    Historical Provider, MD  naproxen (NAPROSYN) 500 MG tablet Take 1 tablet (500 mg total) by mouth 2 (two) times daily. Patient not taking: Reported on 06/16/2015 04/04/15   Glynn OctaveStephen Rancour, MD    Family History Family History  Problem Relation Age of Onset  . Hypertension Mother   . Alcohol abuse Father   . Heart disease Father   . Stroke Father   . Cancer Father     Social History Social History  Substance Use Topics  . Smoking status: Former Smoker    Years: 10.00    Types: Cigarettes    Quit date: 03/14/1993  . Smokeless tobacco: Never Used  . Alcohol use Yes     Comment: occasional - hx alcoholism     Allergies   Review of patient's allergies indicates no known allergies.  Review of Systems Review of Systems  Constitutional: Negative for appetite change and fever.  HENT: Positive for dental problem. Negative for congestion, facial swelling, sore throat and trouble swallowing.   Eyes: Negative for pain and visual disturbance.  Musculoskeletal: Negative for neck pain and neck stiffness.  Neurological: Negative for dizziness, facial asymmetry and headaches.  Hematological: Negative for adenopathy.  All other systems reviewed and are negative.    Physical Exam Updated Vital Signs BP 171/96 (BP Location: Right Arm)   Pulse 65   Temp 97.7 F (36.5 C) (Oral)   Resp 16   Ht  (1.727 m)   Wt 79.4 kg   SpO2 97%   BMI 26.61 kg/m   Physical Exam    Constitutional: He is oriented to person, place, and time. He appears well-developed and well-nourished. No distress.  HENT:  Head: Normocephalic and atraumatic.  Right Ear: Tympanic membrane and ear canal normal.  Left Ear: Tympanic membrane and ear canal normal.  Mouth/Throat: Uvula is midline, oropharynx is clear and moist and mucous membranes are normal. No trismus in the jaw. Dental caries present. No dental abscesses or uvula swelling.  Tenderness and dental caries of the left lower second premolar.  Multiple dental caries and poor dentition.  No facial swelling, obvious dental abscess, trismus, or sublingual abnml.    Neck: Normal range of motion. Neck supple.  Cardiovascular: Normal rate and normal heart sounds.   No murmur heard. Pulmonary/Chest: Effort normal and breath sounds normal.  Musculoskeletal: Normal range of motion.  Lymphadenopathy:    He has no cervical adenopathy.  Neurological: He is alert and oriented to person, place, and time. He exhibits normal muscle tone. Coordination normal.  Skin: Skin is warm and dry.  Nursing note and vitals reviewed.    ED Treatments / Results  Labs (all labs ordered are listed, but only abnormal results are displayed) Labs Reviewed - No data to display  EKG  EKG Interpretation None       Radiology No results found.  Procedures Procedures (including critical care time)  Medications Ordered in ED Medications - No data to display   Initial Impression / Assessment and Plan / ED Course  I have reviewed the triage vital signs and the nursing notes.  Pertinent labs & imaging results that were available during my care of the patient were reviewed by me and considered in my medical decision making (see chart for details).  Clinical Course    Pt well appearing.  No concerning sx's for dental abscess or Ludwig's angina.  Pt given referral info for dentistry.    Final Clinical Impressions(s) / ED Diagnoses   Final  diagnoses:  None    New Prescriptions New Prescriptions   No medications on file     Pauline Aus, Cordelia Poche 10/18/15 1610    Bethann Berkshire, MD 10/18/15 1344

## 2015-11-10 ENCOUNTER — Other Ambulatory Visit: Payer: Self-pay | Admitting: Physician Assistant

## 2015-11-10 ENCOUNTER — Ambulatory Visit: Payer: Self-pay | Admitting: Physician Assistant

## 2015-11-10 ENCOUNTER — Encounter: Payer: Self-pay | Admitting: Physician Assistant

## 2015-11-10 VITALS — BP 120/70 | HR 72 | Temp 97.3°F | Ht 68.0 in | Wt 172.8 lb

## 2015-11-10 DIAGNOSIS — E785 Hyperlipidemia, unspecified: Secondary | ICD-10-CM

## 2015-11-10 DIAGNOSIS — I10 Essential (primary) hypertension: Secondary | ICD-10-CM

## 2015-11-10 DIAGNOSIS — Z1211 Encounter for screening for malignant neoplasm of colon: Secondary | ICD-10-CM

## 2015-11-10 LAB — COMPLETE METABOLIC PANEL WITH GFR
ALT: 22 U/L (ref 9–46)
AST: 22 U/L (ref 10–35)
Albumin: 4.1 g/dL (ref 3.6–5.1)
Alkaline Phosphatase: 79 U/L (ref 40–115)
BUN: 11 mg/dL (ref 7–25)
CO2: 27 mmol/L (ref 20–31)
Calcium: 9.3 mg/dL (ref 8.6–10.3)
Chloride: 100 mmol/L (ref 98–110)
Creat: 1.09 mg/dL (ref 0.70–1.33)
GFR, Est African American: >89 mL/min (ref >=60)
GFR, Est Non African American: 78 mL/min (ref >=60)
Glucose, Bld: 92 mg/dL (ref 65–99)
Potassium: 4.2 mmol/L (ref 3.5–5.3)
Sodium: 137 mmol/L (ref 135–146)
Total Bilirubin: 0.3 mg/dL (ref 0.2–1.2)
Total Protein: 6.9 g/dL (ref 6.1–8.1)

## 2015-11-10 LAB — LIPID PANEL
Cholesterol: 210 mg/dL — ABNORMAL HIGH (ref 125–200)
HDL: 58 mg/dL (ref >=40)
LDL Cholesterol: 125 mg/dL (ref <130)
Total CHOL/HDL Ratio: 3.6 Ratio (ref <=5.0)
Triglycerides: 136 mg/dL (ref <150)
VLDL: 27 mg/dL (ref <30)

## 2015-11-10 MED ORDER — SIMVASTATIN 20 MG PO TABS: 20.0000 mg | ORAL_TABLET | Freq: Every day | ORAL | 0 refills | Status: DC

## 2015-11-10 NOTE — Progress Notes (Signed)
BP 120/70 (BP Location: Right Arm, Patient Position: Sitting, Cuff Size: Normal)   Pulse 72   Temp 97.3 F (36.3 C) (Other (Comment))   Ht 5\' 8"  (1.727 m)   Wt 172 lb 12.8 oz (78.4 kg)   SpO2 97%   BMI 26.27 kg/m    Subjective:    Patient ID: George Schroeder, male    DOB: 11/28/1965, 50 y.o.   MRN: [id number]  HPI: George Schroeder is a 50 y.o. male presenting on 11/10/2015 for Hypertension and Hyperlipidemia   HPI   Pt was seen 7/18 and told again to get labs drawn.  He got them done today.  Pt says he ran out of his cholesterol medicine about a week ago.  Relevant past medical, surgical, family and social history reviewed and updated as indicated. Interim medical history since our last visit reviewed. Allergies and medications reviewed and updated.   Current Outpatient Prescriptions:  .  DULoxetine (CYMBALTA) 30 MG capsule, Take 30 mg by mouth daily., Disp: , Rfl:  .  lisinopril-hydrochlorothiazide (PRINZIDE) 20-12.5 MG per tablet, Take 1 tablet by mouth daily., Disp: 30 tablet, Rfl: 1   Review of Systems  Constitutional: Negative for appetite change, chills, diaphoresis, fatigue, fever and unexpected weight change.  HENT: Negative for congestion, drooling, ear pain, facial swelling, hearing loss, mouth sores, sneezing, sore throat, trouble swallowing and voice change.   Eyes: Negative for pain, discharge, redness, itching and visual disturbance.  Respiratory: Negative for cough, choking, shortness of breath and wheezing.   Cardiovascular: Negative for chest pain, palpitations and leg swelling.  Gastrointestinal: Negative for abdominal pain, blood in stool, constipation, diarrhea and vomiting.  Endocrine: Negative for cold intolerance, heat intolerance and polydipsia.  Genitourinary: Negative for decreased urine volume, dysuria and hematuria.  Musculoskeletal: Negative for arthralgias, back pain and gait problem.  Skin: Negative for rash.  Allergic/Immunologic: Negative  for environmental allergies.  Neurological: Negative for seizures, syncope, light-headedness and headaches.  Hematological: Negative for adenopathy.  Psychiatric/Behavioral: Negative for agitation, dysphoric mood and suicidal ideas. The patient is not nervous/anxious.     Per HPI unless specifically indicated above     Objective:    BP 120/70 (BP Location: Right Arm, Patient Position: Sitting, Cuff Size: Normal)   Pulse 72   Temp 97.3 F (36.3 C) (Other (Comment))   Ht 5\' 8"  (1.727 m)   Wt 172 lb 12.8 oz (78.4 kg)   SpO2 97%   BMI 26.27 kg/m   Wt Readings from Last 3 Encounters:  11/10/15 172 lb 12.8 oz (78.4 kg)  10/18/15 175 lb (79.4 kg)  09/29/15 171 lb (77.6 kg)    Physical Exam  Constitutional: He is oriented to person, place, and time. He appears well-developed and well-nourished.  HENT:  Head: Normocephalic and atraumatic.  Neck: Neck supple.  Cardiovascular: Normal rate and regular rhythm.   Pulmonary/Chest: Effort normal and breath sounds normal. He has no wheezes.  Abdominal: Soft. Bowel sounds are normal. There is no hepatosplenomegaly. There is no tenderness.  Musculoskeletal: He exhibits no edema.  Lymphadenopathy:    He has no cervical adenopathy.  Neurological: He is alert and oriented to person, place, and time.  Skin: Skin is warm and dry.  Psychiatric: He has a normal mood and affect. His behavior is normal.  Vitals reviewed.       Assessment & Plan:   Encounter Diagnoses  Name Primary?  . Essential hypertension, benign Yes  . Hyperlipidemia   . Special screening  for malignant neoplasms, colon     -Gave one month simvastatin- need to see labs -He has one refill on the lisinopril/hctz -Gave ifobt for colon cancer screening -f/u 4 wk to recheck bp and review labs

## 2015-12-09 ENCOUNTER — Ambulatory Visit: Payer: Self-pay | Admitting: Physician Assistant

## 2015-12-10 ENCOUNTER — Encounter: Payer: Self-pay | Admitting: Physician Assistant

## 2015-12-24 ENCOUNTER — Encounter: Payer: Self-pay | Admitting: Student

## 2015-12-29 ENCOUNTER — Other Ambulatory Visit: Payer: Self-pay | Admitting: Physician Assistant

## 2015-12-29 DIAGNOSIS — I10 Essential (primary) hypertension: Secondary | ICD-10-CM

## 2016-06-14 ENCOUNTER — Emergency Department (HOSPITAL_COMMUNITY): Payer: Self-pay

## 2016-06-14 ENCOUNTER — Encounter (HOSPITAL_COMMUNITY): Payer: Self-pay | Admitting: Emergency Medicine

## 2016-06-14 ENCOUNTER — Emergency Department (HOSPITAL_COMMUNITY)
Admission: EM | Admit: 2016-06-14 | Discharge: 2016-06-15 | Disposition: A | Discharge status: Home / Self Care | Payer: Self-pay | Attending: Emergency Medicine | Admitting: Emergency Medicine

## 2016-06-14 DIAGNOSIS — Z87891 Personal history of nicotine dependence: Secondary | ICD-10-CM | POA: Insufficient documentation

## 2016-06-14 DIAGNOSIS — I10 Essential (primary) hypertension: Secondary | ICD-10-CM | POA: Insufficient documentation

## 2016-06-14 DIAGNOSIS — R072 Precordial pain: Secondary | ICD-10-CM | POA: Insufficient documentation

## 2016-06-14 LAB — CBC
HCT: 40.1 % (ref 39.0–52.0)
Hemoglobin: 13.6 g/dL (ref 13.0–17.0)
MCH: 31.1 pg (ref 26.0–34.0)
MCHC: 33.9 g/dL (ref 30.0–36.0)
MCV: 91.8 fL (ref 78.0–100.0)
Platelets: 245 10*3/uL (ref 150–400)
RBC: 4.37 MIL/uL (ref 4.22–5.81)
RDW: 12.9 % (ref 11.5–15.5)
WBC: 5.5 10*3/uL (ref 4.0–10.5)

## 2016-06-14 NOTE — ED Triage Notes (Signed)
Pt states his blood pressure has been up the last couple of days and he c/o right sided chest pain and left jaw pain.

## 2016-06-14 NOTE — ED Provider Notes (Signed)
[facility] DEPT Provider Note   CSN: [id number] Arrival date & time: 06/14/16  2231   By signing my name below, I, George Schroeder, attest that this documentation has been prepared under the direction and in the presence of George Octave, MD. Electronically Signed: Bobbie Schroeder, Scribe. 06/14/16. 11:58 PM. History   Chief Complaint Chief Complaint  Patient presents with  . Chest Pain    The history is provided by the patient. No language interpreter was used.  HPI Comments: George Schroeder is a 51 y.o. male who presents to the Emergency Department complaining of worsening intermittent CP for the past couple days. He states that his CP usually only last for a few minutes at a time. He reports dizziness, nausea, and jaw pain with each episode of CP. He states that he sometimes will have shortness of breath and numbness in his right arm. with his episodes. He has around 2 to 3 episodes of chest pain a day. He states that the pain worsens with stress. His pain has currently resolved. His last stress test was about a year ago and no illness were noted at that time. The patient also reports having an elevated BP for the last couple of days. The patient states that he has been out of his BP and cholesterol medications for the past couple of months. He is currently on HCTZ. 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 He states that he quit smoking 6 years ago. He denies hx of diabetes. He also denies diarrhea, constipation, and any urinary symptoms.   Past Medical History:  Diagnosis Date  . Alcohol abuse   . Anxiety   . Essential hypertension, benign   . Hyperlipidemia   . TBI (traumatic brain injury) [facility]) age 51    Patient Active Problem List   Diagnosis Date Noted  . Cardiac murmur 07/10/2013  . Sinus tachycardia 07/10/2013  . Screening for prostate cancer 05/02/2012  . Atypical chest pain 03/12/2012  . Left foot pain 03/12/2012  . Onychomycosis 01/01/2012  . Low  back pain 10/22/2011  . Traumatic partial tear of right biceps tendon 04/26/2011  . Alcohol abuse 12/14/2010  . 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 Hyperlipidemia 06/25/2009  . OTHER NONSPECIFIC ABNORMAL SERUM ENZYME LEVELS 06/25/2009  . 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 HYPERTENSION, BENIGN SYSTEMIC 05/11/2006  . BPH 05/11/2006    Past Surgical History:  Procedure Laterality Date  . FACIAL RECONSTRUCTION SURGERY         Home Medications    Prior to Admission medications   Medication Sig Start Date End Date Taking? Authorizing Provider  lisinopril-hydrochlorothiazide (PRINZIDE) 20-12.5 MG per tablet Take 1 tablet by mouth daily. Patient not taking: Reported on 06/14/2016 11/27/13 11/10/15  Joanna Puff, MD  simvastatin (ZOCOR) 20 MG tablet Take 1 tablet (20 mg total) by mouth at bedtime. Patient not taking: Reported on 06/14/2016 11/10/15   Jacquelin Hawking, PA-C    Family History Family History  Problem Relation Age of Onset  . Hypertension Mother   . Alcohol abuse Father   . Heart disease Father   . Stroke Father   . Cancer Father     Social History Social History  Substance Use Topics  . Smoking status: Former Smoker    Years: 10.00    Types: Cigarettes    Quit date: 03/14/1993  . Smokeless tobacco: Never Used  . Alcohol use Yes     Comment: occasional - hx alcoholism  Allergies   Patient has no known allergies.   Review of Systems Review of Systems A complete 10 system review of systems was obtained and all systems are negative except as noted in the HPI and PMH.   Physical Exam Updated Vital Signs BP (!) 161/90 (BP Location: Right Arm)   Pulse (!) 103   Temp 98 F (36.7 C)   Resp 20   Ht  (1.727 m)   Wt 180 lb (81.6 kg)   SpO2 98%   BMI 27.37 kg/m   Physical Exam  Constitutional: He is oriented to person, place, and time. He appears well-developed and well-nourished. No distress.  HENT:  Head: Normocephalic and  atraumatic.  Mouth/Throat: Oropharynx is clear and moist. No oropharyngeal exudate.  Eyes: Conjunctivae and EOM are normal. Pupils are equal, round, and reactive to light.  Neck: Normal range of motion. Neck supple.  No meningismus.  Cardiovascular: Normal rate, regular rhythm, normal heart sounds and intact distal pulses.   No murmur heard. Equal radial pulse.  Pulmonary/Chest: Effort normal and breath sounds normal. No respiratory distress. He exhibits no tenderness.  Chest wall is non-tender.  Abdominal: Soft. There is no tenderness. There is no rebound and no guarding.  Musculoskeletal: Normal range of motion. He exhibits no edema or tenderness.  Equal grip strengths.  Neurological: He is alert and oriented to person, place, and time. No cranial nerve deficit. He exhibits normal muscle tone. Coordination normal.   5/5 strength throughout. CN 2-12 intact.Equal grip strength.   Skin: Skin is warm.  Psychiatric: He has a normal mood and affect. His behavior is normal.  Nursing note and vitals reviewed.  ED Treatments / Results  DIAGNOSTIC STUDIES: Oxygen Saturation is 98% on RA, normal by my interpretation.    COORDINATION OF CARE: 11:18 PM Discussed treatment plan with pt at bedside and pt agreed to plan. I will do a full cardiac work-up for the patient.  Labs (all labs ordered are listed, but only abnormal results are displayed) Labs Reviewed  BASIC METABOLIC PANEL - Abnormal; Notable for the following:       Result Value   Potassium 3.4 (*)    Glucose, Bld 105 (*)    Calcium 8.8 (*)    All other components within normal limits  D-DIMER, QUANTITATIVE (NOT AT [facility]) - Abnormal; Notable for the following:    D-Dimer, Quant 0.60 (*)    All other components within normal limits  CBC  TROPONIN I  TROPONIN I    EKG  EKG Interpretation  Date/Time:  Tuesday June 14 2016 22:42:40 EDT Ventricular Rate:  107 PR Interval:  134 QRS Duration: 86 QT Interval:  350 QTC  Calculation: 467 R Axis:   42 Text Interpretation:  Sinus tachycardia Possible Left atrial enlargement Borderline ECG No significant change was found Confirmed by Manus Gunning  MD, Orien Mayhall [phone]) on 06/14/2016 11:13:42 PM       Radiology Dg Chest 2 View  Result Date: 06/14/2016 CLINICAL DATA:  Mid to right chest soreness, shortness of breath EXAM: CHEST  2 VIEW COMPARISON:  06/04/2013 FINDINGS: Old deformity of the distal left clavicle. Hyperinflation. No acute infiltrate or effusion. Normal heart size. No pneumothorax. IMPRESSION: No active cardiopulmonary disease. Electronically Signed   By: Jasmine Pang M.D.   On: 06/14/2016 23:13    Procedures Procedures (including critical care time)  Medications Ordered in ED Medications - No data to display   Initial Impression / Assessment and Plan / ED Course  I have reviewed the triage vital signs and the nursing notes.  Pertinent labs & imaging results that were available during my care of the patient were reviewed by me and considered in my medical decision making (see chart for details).     Patient presents with intermittent right-sided chest pain over the past several days and lashes to a few minutes at a time. History of hypertension, has been out of his blood pressure medication for several months. Denies any chest pain currently.  EKG is nonischemic. Heart score is 2.  Troponin negative.  D-dimer slightly elevated.  CT negative for pulmonary embolism other acute pathology. Troponin negative 2.  Pain is atypical for ACS. No evidence of ACS or PE today. Patient would benefit from outpatient stress test. Cardiology referral given. Return precautions discussed. Blood pressure medications refilled.  Final Clinical Impressions(s) / ED Diagnoses   Final diagnoses:  Precordial pain    New Prescriptions New Prescriptions   No medications on file   I personally performed the services described in this documentation, which was  scribed in my presence. The recorded information has been reviewed and is accurate.    George Octave, MD 06/15/16 [phone]

## 2016-06-15 ENCOUNTER — Emergency Department (HOSPITAL_COMMUNITY): Payer: Self-pay

## 2016-06-15 LAB — BASIC METABOLIC PANEL
Anion gap: 10 (ref 5–15)
BUN: 11 mg/dL (ref 6–20)
CO2: 25 mmol/L (ref 22–32)
Calcium: 8.8 mg/dL — ABNORMAL LOW (ref 8.9–10.3)
Chloride: 101 mmol/L (ref 101–111)
Creatinine, Ser: 0.98 mg/dL (ref 0.61–1.24)
GFR calc Af Amer: >60 mL/min (ref >60)
GFR calc non Af Amer: >60 mL/min (ref >60)
Glucose, Bld: 105 mg/dL — ABNORMAL HIGH (ref 65–99)
Potassium: 3.4 mmol/L — ABNORMAL LOW (ref 3.5–5.1)
Sodium: 136 mmol/L (ref 135–145)

## 2016-06-15 LAB — D-DIMER, QUANTITATIVE: D-Dimer, Quant: 0.6 ug/mL-FEU — ABNORMAL HIGH (ref 0.00–0.50)

## 2016-06-15 LAB — TROPONIN I
Troponin I: <0.03 ng/mL (ref <0.03)
Troponin I: <0.03 ng/mL (ref <0.03)

## 2016-06-15 MED ORDER — IBUPROFEN 800 MG PO TABS
800.0000 mg | ORAL_TABLET | Freq: Once | ORAL | Status: AC
  Administered 2016-06-15: 800 mg via ORAL
  Filled 2016-06-15: qty 1

## 2016-06-15 MED ORDER — IOPAMIDOL (ISOVUE-370) INJECTION 76%
100.0000 mL | Freq: Once | INTRAVENOUS | Status: AC | PRN
  Administered 2016-06-15: 100 mL via INTRAVENOUS

## 2016-06-15 MED ORDER — SIMVASTATIN 20 MG PO TABS: 20.0000 mg | ORAL_TABLET | Freq: Every day | ORAL | 0 refills | Status: DC

## 2016-06-15 MED ORDER — LISINOPRIL-HYDROCHLOROTHIAZIDE 20-12.5 MG PO TABS: 1.0000 | ORAL_TABLET | Freq: Every day | ORAL | 0 refills | Status: DC

## 2016-06-15 NOTE — ED Notes (Signed)
Pt ambulatory to waiting room. Pt verbalized understanding of discharge instructions.   

## 2016-06-15 NOTE — Discharge Instructions (Signed)
There is no evidence of a heart attack or blood clot in the lung. Stop smoking. Follow-up with the cardiologist for a stress test. Return to the ED if you develop new or worsening symptoms.

## 2016-06-15 NOTE — ED Notes (Signed)
Patient ambulated to restroom with no assistance.  

## 2016-08-13 ENCOUNTER — Emergency Department (HOSPITAL_COMMUNITY)
Admission: EM | Admit: 2016-08-13 | Discharge: 2016-08-13 | Disposition: A | Discharge status: Home / Self Care | Payer: Self-pay | Attending: Emergency Medicine | Admitting: Emergency Medicine

## 2016-08-13 ENCOUNTER — Encounter (HOSPITAL_COMMUNITY): Payer: Self-pay | Admitting: *Deleted

## 2016-08-13 DIAGNOSIS — M79662 Pain in left lower leg: Secondary | ICD-10-CM | POA: Insufficient documentation

## 2016-08-13 DIAGNOSIS — Z87891 Personal history of nicotine dependence: Secondary | ICD-10-CM | POA: Insufficient documentation

## 2016-08-13 DIAGNOSIS — Y92318 Other athletic court as the place of occurrence of the external cause: Secondary | ICD-10-CM | POA: Insufficient documentation

## 2016-08-13 DIAGNOSIS — M79605 Pain in left leg: Secondary | ICD-10-CM

## 2016-08-13 DIAGNOSIS — R2242 Localized swelling, mass and lump, left lower limb: Secondary | ICD-10-CM | POA: Insufficient documentation

## 2016-08-13 DIAGNOSIS — W0110XA Fall on same level from slipping, tripping and stumbling with subsequent striking against unspecified object, initial encounter: Secondary | ICD-10-CM | POA: Insufficient documentation

## 2016-08-13 DIAGNOSIS — Y999 Unspecified external cause status: Secondary | ICD-10-CM | POA: Insufficient documentation

## 2016-08-13 DIAGNOSIS — Y9368 Activity, volleyball (beach) (court): Secondary | ICD-10-CM | POA: Insufficient documentation

## 2016-08-13 DIAGNOSIS — Z79899 Other long term (current) drug therapy: Secondary | ICD-10-CM | POA: Insufficient documentation

## 2016-08-13 DIAGNOSIS — I10 Essential (primary) hypertension: Secondary | ICD-10-CM | POA: Insufficient documentation

## 2016-08-13 DIAGNOSIS — M7989 Other specified soft tissue disorders: Secondary | ICD-10-CM

## 2016-08-13 LAB — CBC WITH DIFFERENTIAL/PLATELET
Basophils Absolute: 0 10*3/uL (ref 0.0–0.1)
Basophils Relative: 0 %
Eosinophils Absolute: 0.2 10*3/uL (ref 0.0–0.7)
Eosinophils Relative: 3 %
HCT: 36.8 % — ABNORMAL LOW (ref 39.0–52.0)
Hemoglobin: 12.4 g/dL — ABNORMAL LOW (ref 13.0–17.0)
Lymphocytes Relative: 27 %
Lymphs Abs: 1.6 10*3/uL (ref 0.7–4.0)
MCH: 31.2 pg (ref 26.0–34.0)
MCHC: 33.7 g/dL (ref 30.0–36.0)
MCV: 92.5 fL (ref 78.0–100.0)
Monocytes Absolute: 0.4 10*3/uL (ref 0.1–1.0)
Monocytes Relative: 6 %
Neutro Abs: 3.9 10*3/uL (ref 1.7–7.7)
Neutrophils Relative %: 64 %
Platelets: 249 10*3/uL (ref 150–400)
RBC: 3.98 MIL/uL — ABNORMAL LOW (ref 4.22–5.81)
RDW: 13.6 % (ref 11.5–15.5)
WBC: 6 10*3/uL (ref 4.0–10.5)

## 2016-08-13 LAB — BASIC METABOLIC PANEL
Anion gap: 9 (ref 5–15)
BUN: 12 mg/dL (ref 6–20)
CO2: 27 mmol/L (ref 22–32)
Calcium: 9 mg/dL (ref 8.9–10.3)
Chloride: 105 mmol/L (ref 101–111)
Creatinine, Ser: 1.12 mg/dL (ref 0.61–1.24)
GFR calc Af Amer: >60 mL/min (ref >60)
GFR calc non Af Amer: >60 mL/min (ref >60)
Glucose, Bld: 102 mg/dL — ABNORMAL HIGH (ref 65–99)
Potassium: 3.4 mmol/L — ABNORMAL LOW (ref 3.5–5.1)
Sodium: 141 mmol/L (ref 135–145)

## 2016-08-13 MED ORDER — SULFAMETHOXAZOLE-TRIMETHOPRIM 800-160 MG PO TABS: 1.0000 | ORAL_TABLET | Freq: Two times a day (BID) | ORAL | 0 refills | Status: AC

## 2016-08-13 MED ORDER — SULFAMETHOXAZOLE-TRIMETHOPRIM 800-160 MG PO TABS
1.0000 | ORAL_TABLET | Freq: Once | ORAL | Status: AC
  Administered 2016-08-13: 1 via ORAL
  Filled 2016-08-13: qty 1

## 2016-08-13 NOTE — ED Triage Notes (Signed)
Pt fell today at a family gathering, c/o left knee and lower leg pain, swelling noted to left LE.  Denies hitting head.

## 2016-08-13 NOTE — ED Triage Notes (Signed)
Pt reports a fall around 1800. He reports L knee pain  Pt does not report a PCP

## 2016-08-13 NOTE — ED Provider Notes (Signed)
[facility]-EMERGENCY DEPT Provider Note   CSN: [id number] Arrival date & time: 08/13/16  2102     History   Chief Complaint Chief Complaint  Patient presents with  . Fall    HPI Pecola LawlessWillie P Manson PasseyBrown is a 51 y.o. male.  HPI  Larey SeatFell last week and today as well - has had swelling since yesterday in the LLE.  He denies dyspnea / SOB / CP or f/c/n/v.  He has ongoing pain in the R mid calf.  The fall that occurred today was while playing volleyball when he fell onto his buttock.  The fall last week occurred when he was trying to throw trash into a trash can.  Poor historian.  No treatment prior to arrival.  Past Medical History:  Diagnosis Date  . Alcohol abuse   . Anxiety   . Essential hypertension, benign   . Hyperlipidemia   . TBI (traumatic brain injury) [facility](HCC) age 51    Patient Active Problem List   Diagnosis Date Noted  . Cardiac murmur 07/10/2013  . Sinus tachycardia 07/10/2013  . Screening for prostate cancer 05/02/2012  . Atypical chest pain 03/12/2012  . Left foot pain 03/12/2012  . Onychomycosis 01/01/2012  . Low back pain 10/22/2011  . Traumatic partial tear of right biceps tendon 04/26/2011  . Alcohol abuse 12/14/2010  . 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 Hyperlipidemia 06/25/2009  . OTHER NONSPECIFIC ABNORMAL SERUM ENZYME LEVELS 06/25/2009  . 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 HYPERTENSION, BENIGN SYSTEMIC 05/11/2006  . BPH 05/11/2006    Past Surgical History:  Procedure Laterality Date  . FACIAL RECONSTRUCTION SURGERY         Home Medications    Prior to Admission medications   Medication Sig Start Date End Date Taking? Authorizing Provider  lisinopril-hydrochlorothiazide (PRINZIDE) 20-12.5 MG tablet Take 1 tablet by mouth daily. 06/15/16 05/29/18  Rancour, Jeannett SeniorStephen, MD  simvastatin (ZOCOR) 20 MG tablet Take 1 tablet (20 mg total) by mouth at bedtime. 06/15/16   Rancour, Jeannett SeniorStephen, MD  sulfamethoxazole-trimethoprim (BACTRIM DS,SEPTRA DS) 800-160  MG tablet Take 1 tablet by mouth 2 (two) times daily. 08/13/16 08/20/16  Eber HongMiller, Wise Fees, MD    Family History Family History  Problem Relation Age of Onset  . Hypertension Mother   . Alcohol abuse Father   . Heart disease Father   . Stroke Father   . Cancer Father     Social History Social History  Substance Use Topics  . Smoking status: Former Smoker    Years: 10.00    Types: Cigarettes    Quit date: 03/14/1993  . Smokeless tobacco: Never Used  . Alcohol use Yes     Comment: occasional - hx alcoholism     Allergies   Patient has no known allergies.   Review of Systems Review of Systems  All other systems reviewed and are negative.    Physical Exam Updated Vital Signs BP (!) 153/94 (BP Location: Right Arm)   Pulse 86   Temp 97.8 F (36.6 C) (Oral)   Resp 18   Ht 5\' 8"  (1.727 m)   Wt 77.6 kg (171 lb)   SpO2 97%   BMI 26.00 kg/m   Physical Exam  Constitutional: He appears well-developed and well-nourished. No distress.  HENT:  Head: Normocephalic and atraumatic.  Mouth/Throat: Oropharynx is clear and moist. No oropharyngeal exudate.  Eyes: Conjunctivae and EOM are normal. Pupils are equal, round, and reactive to light. Right eye exhibits no discharge. Left eye exhibits no  discharge. No scleral icterus.  Neck: Normal range of motion. Neck supple. No JVD present. No thyromegaly present.  Cardiovascular: Normal rate, regular rhythm, normal heart sounds and intact distal pulses.  Exam reveals no gallop and no friction rub.   No murmur heard. Pulmonary/Chest: Effort normal and breath sounds normal. No respiratory distress. He has no wheezes. He has no rales.  Abdominal: Soft. Bowel sounds are normal. He exhibits no distension and no mass. There is no tenderness.  Musculoskeletal: Normal range of motion. He exhibits edema ( 1+ pitting to the LLE from the prox tip to the foot). He exhibits no tenderness.  Has swelling from the mid lower leg to the foot - has some redness  on the medial calf.  It is warm and red.  No induration.  Lymphadenopathy:    He has no cervical adenopathy.  Neurological: He is alert. Coordination normal.  Skin: Skin is warm and dry. No rash noted. No erythema.  Rash as above  Psychiatric: He has a normal mood and affect. His behavior is normal.  Nursing note and vitals reviewed.    ED Treatments / Results  Labs (all labs ordered are listed, but only abnormal results are displayed) Labs Reviewed  CBC WITH DIFFERENTIAL/PLATELET - Abnormal; Notable for the following:       Result Value   RBC 3.98 (*)    Hemoglobin 12.4 (*)    HCT 36.8 (*)    All other components within normal limits  BASIC METABOLIC PANEL - Abnormal; Notable for the following:    Potassium 3.4 (*)    Glucose, Bld 102 (*)    All other components within normal limits     Radiology No results found.  Procedures Procedures (including critical care time)  Medications Ordered in ED Medications  sulfamethoxazole-trimethoprim (BACTRIM DS,SEPTRA DS) 800-160 MG per tablet 1 tablet (1 tablet Oral Given 08/13/16 2208)     US shows some cobblestoning of the soft tissue unilaterally Will treat for infection Labs show no leukocytosis BMP normal  [place] in the AM formally Pt agreeable.  Bactrim given here and for home by Rx. To come back in the AM for [place]  Initial Impression / Assessment and Plan / ED Course  I have reviewed the triage vital signs and the nursing notes.  Pertinent labs & imaging results that were available during my care of the patient were reviewed by me and considered in my medical decision making (see chart for details).     Final Clinical Impressions(s) / ED Diagnoses   Final diagnoses:  Pain and swelling of left lower extremity    New Prescriptions New Prescriptions   SULFAMETHOXAZOLE-TRIMETHOPRIM (BACTRIM DS,SEPTRA DS) 800-160 MG TABLET    Take 1 tablet by mouth 2 (two) times daily.     Eber Hong, MD 08/13/16 [phone]

## 2016-08-13 NOTE — Discharge Instructions (Signed)
If your ultrasound tomorrow shows some blood clot, you will need to have a blood thinner.  If it doesn't then you should take the Bactrim twice daily for 10 days  [facility]    Kari BaarsEdward Hawkins MD. Specialty: Pulmonary Disease Contact information: [address]  [address]  [place] [address]  [phone]   Syliva OvermanMargaret Simpson, MD. Specialty: [facility] Contact information: [address]  [place] [address]  [phone]   Lilyan PuntScott Luking, MD. Specialty: [facility] Contact information: [address]  [address]  [place] [address]  [phone]   Avon Gullyesfaye Fanta, MD Specialty: Internal Medicine Contact information: [address]   [place] [address]  [phone]   Catalina PizzaZach Hall, MD. Specialty: Internal Medicine Contact information: [address]  [place] [address]  [phone]   Butch PennyAngus Mcinnis, MD. Specialty: Family Medicine Contact information: [address]  [place] [address]  [phone]   John GiovanniStephen Knowlton, MD. Specialty: [facility] Contact information: [address]  [address]  [place] [address]  [phone]   Carylon Perchesoy Fagan, MD. Specialty: Internal Medicine Contact information: [address]  [address]  [place] [address]  [phone]

## 2016-08-14 ENCOUNTER — Ambulatory Visit (HOSPITAL_COMMUNITY)
Admission: RE | Admit: 2016-08-14 | Discharge: 2016-08-14 | Disposition: A | Discharge status: Home / Self Care | Payer: Self-pay | Source: Ambulatory Visit | Attending: Emergency Medicine | Admitting: Emergency Medicine

## 2016-08-14 DIAGNOSIS — M7989 Other specified soft tissue disorders: Secondary | ICD-10-CM | POA: Insufficient documentation

## 2016-08-14 NOTE — ED Provider Notes (Signed)
Venous Doppler of left lower extremity shows no DVT. This was discussed with the patient.   Donnetta Hutchingook, Ferrin Liebig, MD 08/14/16 1154

## 2016-08-16 ENCOUNTER — Emergency Department (HOSPITAL_COMMUNITY)
Admission: EM | Admit: 2016-08-16 | Discharge: 2016-08-16 | Disposition: A | Discharge status: Home / Self Care | Payer: Self-pay | Attending: Emergency Medicine | Admitting: Emergency Medicine

## 2016-08-16 ENCOUNTER — Encounter (HOSPITAL_COMMUNITY): Payer: Self-pay

## 2016-08-16 DIAGNOSIS — M79605 Pain in left leg: Secondary | ICD-10-CM

## 2016-08-16 DIAGNOSIS — M7122 Synovial cyst of popliteal space [Baker], left knee: Secondary | ICD-10-CM | POA: Insufficient documentation

## 2016-08-16 DIAGNOSIS — Z79899 Other long term (current) drug therapy: Secondary | ICD-10-CM | POA: Insufficient documentation

## 2016-08-16 DIAGNOSIS — Z87891 Personal history of nicotine dependence: Secondary | ICD-10-CM | POA: Insufficient documentation

## 2016-08-16 DIAGNOSIS — I10 Essential (primary) hypertension: Secondary | ICD-10-CM | POA: Insufficient documentation

## 2016-08-16 MED ORDER — PREDNISONE 50 MG PO TABS
60.0000 mg | ORAL_TABLET | Freq: Once | ORAL | Status: AC
  Administered 2016-08-16: 60 mg via ORAL
  Filled 2016-08-16: qty 1

## 2016-08-16 MED ORDER — MELOXICAM 15 MG PO TABS: 15.0000 mg | ORAL_TABLET | Freq: Every day | ORAL | 0 refills | Status: DC

## 2016-08-16 MED ORDER — PREDNISONE 20 MG PO TABS: ORAL_TABLET | ORAL | 0 refills | Status: DC

## 2016-08-16 MED ORDER — NAPROXEN 250 MG PO TABS
500.0000 mg | ORAL_TABLET | Freq: Once | ORAL | Status: AC
  Administered 2016-08-16: 500 mg via ORAL
  Filled 2016-08-16: qty 2

## 2016-08-16 MED ORDER — MELOXICAM 15 MG PO TABS
15.0000 mg | ORAL_TABLET | Freq: Once | ORAL | Status: DC
  Filled 2016-08-16: qty 1

## 2016-08-16 NOTE — Discharge Instructions (Signed)
Contact a health care provider if: You have knee pain, stiffness, or swelling that does not get better. Get help right away if: You have sudden or worsening pain and swelling in your calf area. SEEK IMMEDIATE MEDICAL ATTENTION IF: New numbness, tingling, weakness, or problem with the use of your arms or legs.  Severe back pain not relieved with medications.  Change in bowel or bladder control.  Increasing pain in any areas of the body (such as chest or abdominal pain).  Shortness of breath, dizziness or fainting.  Nausea (feeling sick to your stomach), vomiting, fever, or sweats.

## 2016-08-16 NOTE — ED Triage Notes (Signed)
Pt seen several days ago for left leg pain, leg conts to hurt and swell. Pt reports he fell at work 2 weeks ago and has been bothering him since fall

## 2016-08-16 NOTE — ED Notes (Signed)
Ace wrap to left knee.
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
## 2016-09-29 ENCOUNTER — Emergency Department (HOSPITAL_COMMUNITY)
Admission: EM | Admit: 2016-09-29 | Discharge: 2016-09-29 | Disposition: A | Discharge status: Home / Self Care | Payer: Self-pay | Attending: Emergency Medicine | Admitting: Emergency Medicine

## 2016-09-29 ENCOUNTER — Encounter (HOSPITAL_COMMUNITY): Payer: Self-pay | Admitting: Emergency Medicine

## 2016-09-29 DIAGNOSIS — Z87891 Personal history of nicotine dependence: Secondary | ICD-10-CM | POA: Insufficient documentation

## 2016-09-29 DIAGNOSIS — Z760 Encounter for issue of repeat prescription: Secondary | ICD-10-CM

## 2016-09-29 DIAGNOSIS — I10 Essential (primary) hypertension: Secondary | ICD-10-CM | POA: Insufficient documentation

## 2016-09-29 DIAGNOSIS — Z79899 Other long term (current) drug therapy: Secondary | ICD-10-CM | POA: Insufficient documentation

## 2016-09-29 LAB — I-STAT CHEM 8, ED
BUN: 11 mg/dL (ref 6–20)
Calcium, Ion: 1.15 mmol/L (ref 1.15–1.40)
Chloride: 102 mmol/L (ref 101–111)
Creatinine, Ser: 1 mg/dL (ref 0.61–1.24)
Glucose, Bld: 102 mg/dL — ABNORMAL HIGH (ref 65–99)
HCT: 41 % (ref 39.0–52.0)
Hemoglobin: 13.9 g/dL (ref 13.0–17.0)
Potassium: 4.3 mmol/L (ref 3.5–5.1)
Sodium: 142 mmol/L (ref 135–145)
TCO2: 28 mmol/L (ref 0–100)

## 2016-09-29 MED ORDER — LISINOPRIL 10 MG PO TABS
20.0000 mg | ORAL_TABLET | Freq: Once | ORAL | Status: AC
  Administered 2016-09-29: 20 mg via ORAL
  Filled 2016-09-29: qty 2

## 2016-09-29 MED ORDER — ACETAMINOPHEN 500 MG PO TABS
1000.0000 mg | ORAL_TABLET | Freq: Once | ORAL | Status: AC
  Administered 2016-09-29: 1000 mg via ORAL
  Filled 2016-09-29: qty 2

## 2016-09-29 MED ORDER — LISINOPRIL-HYDROCHLOROTHIAZIDE 20-12.5 MG PO TABS: 1.0000 | ORAL_TABLET | Freq: Every day | ORAL | 1 refills | Status: DC

## 2016-09-29 MED ORDER — HYDROCHLOROTHIAZIDE 12.5 MG PO CAPS
12.5000 mg | ORAL_CAPSULE | Freq: Once | ORAL | Status: AC
  Administered 2016-09-29: 12.5 mg via ORAL
  Filled 2016-09-29: qty 1
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
## 2016-09-29 NOTE — ED Triage Notes (Signed)
Pt reports being out of hypertension medications.  BP has been elevated with headache.

## 2016-12-03 ENCOUNTER — Emergency Department (HOSPITAL_COMMUNITY)
Admission: EM | Admit: 2016-12-03 | Discharge: 2016-12-03 | Disposition: A | Discharge status: Home / Self Care | Payer: Self-pay | Attending: Emergency Medicine | Admitting: Emergency Medicine

## 2016-12-03 ENCOUNTER — Emergency Department (HOSPITAL_COMMUNITY): Payer: Self-pay

## 2016-12-03 ENCOUNTER — Encounter (HOSPITAL_COMMUNITY): Payer: Self-pay | Admitting: Emergency Medicine

## 2016-12-03 DIAGNOSIS — S9032XA Contusion of left foot, initial encounter: Secondary | ICD-10-CM | POA: Insufficient documentation

## 2016-12-03 DIAGNOSIS — W208XXA Other cause of strike by thrown, projected or falling object, initial encounter: Secondary | ICD-10-CM | POA: Insufficient documentation

## 2016-12-03 DIAGNOSIS — Y939 Activity, unspecified: Secondary | ICD-10-CM | POA: Insufficient documentation

## 2016-12-03 DIAGNOSIS — Z79899 Other long term (current) drug therapy: Secondary | ICD-10-CM | POA: Insufficient documentation

## 2016-12-03 DIAGNOSIS — Y999 Unspecified external cause status: Secondary | ICD-10-CM | POA: Insufficient documentation

## 2016-12-03 DIAGNOSIS — Z87891 Personal history of nicotine dependence: Secondary | ICD-10-CM | POA: Insufficient documentation

## 2016-12-03 DIAGNOSIS — Y929 Unspecified place or not applicable: Secondary | ICD-10-CM | POA: Insufficient documentation

## 2016-12-03 DIAGNOSIS — I10 Essential (primary) hypertension: Secondary | ICD-10-CM | POA: Insufficient documentation
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
## 2016-12-03 NOTE — ED Triage Notes (Signed)
Pt reports dropping a flowerpot on his left foot 3 days ago with pain and swelling since.

## 2016-12-03 NOTE — Discharge Instructions (Signed)
Please keep your foot elevated as much as possible. Use the wooden shoe until you're able to safely wear your regular shoe. Use Tylenol every 4 hours, or ibuprofen every 6 hours for pain.

## 2016-12-03 NOTE — ED Notes (Signed)
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

## 2016-12-03 NOTE — ED Notes (Signed)
HB in to assess 

## 2017-01-17 ENCOUNTER — Encounter (HOSPITAL_COMMUNITY): Payer: Self-pay | Admitting: Emergency Medicine

## 2017-01-17 ENCOUNTER — Emergency Department (HOSPITAL_COMMUNITY)
Admission: EM | Admit: 2017-01-17 | Discharge: 2017-01-17 | Disposition: A | Discharge status: Home / Self Care | Payer: Self-pay | Attending: Emergency Medicine | Admitting: Emergency Medicine

## 2017-01-17 DIAGNOSIS — Y929 Unspecified place or not applicable: Secondary | ICD-10-CM | POA: Insufficient documentation

## 2017-01-17 DIAGNOSIS — Y939 Activity, unspecified: Secondary | ICD-10-CM | POA: Insufficient documentation

## 2017-01-17 DIAGNOSIS — Z23 Encounter for immunization: Secondary | ICD-10-CM | POA: Insufficient documentation

## 2017-01-17 DIAGNOSIS — I10 Essential (primary) hypertension: Secondary | ICD-10-CM | POA: Insufficient documentation

## 2017-01-17 DIAGNOSIS — Z87891 Personal history of nicotine dependence: Secondary | ICD-10-CM | POA: Insufficient documentation

## 2017-01-17 DIAGNOSIS — Y990 Civilian activity done for income or pay: Secondary | ICD-10-CM | POA: Insufficient documentation

## 2017-01-17 DIAGNOSIS — S51811A Laceration without foreign body of right forearm, initial encounter: Secondary | ICD-10-CM | POA: Insufficient documentation

## 2017-01-17 DIAGNOSIS — W260XXA Contact with knife, initial encounter: Secondary | ICD-10-CM | POA: Insufficient documentation

## 2017-01-17 DIAGNOSIS — Z79899 Other long term (current) drug therapy: Secondary | ICD-10-CM | POA: Insufficient documentation

## 2017-01-17 MED ORDER — TETANUS-DIPHTH-ACELL PERTUSSIS 5-2.5-18.5 LF-MCG/0.5 IM SUSP
0.5000 mL | Freq: Once | INTRAMUSCULAR | Status: AC
  Administered 2017-01-17: 0.5 mL via INTRAMUSCULAR
  Filled 2017-01-17: qty 0.5

## 2017-01-17 NOTE — ED Provider Notes (Signed)
[facility] EMERGENCY DEPARTMENT Provider Note   CSN: [id number] Arrival date & time: 01/17/17  1522     History   Chief Complaint Chief Complaint  Patient presents with  . Laceration    HPI George Schroeder is a 51 y.o. male.  The history is provided by the patient.  Laceration   The incident occurred less than 1 hour ago. The laceration is located on the right arm. The laceration is 1 cm in size. The laceration mechanism was a a clean knife. The pain is at a severity of 2/10. The pain is mild. The pain has been constant since onset. He reports no foreign bodies present. His tetanus status is out of date.   Pt cut arm using a blade at work. He denies weakness or numbness distal to the injury site.  Past Medical History:  Diagnosis Date  . Alcohol abuse   . Anxiety   . Essential hypertension, benign   . Hyperlipidemia   . TBI (traumatic brain injury) [facility](HCC) age 51    Patient Active Problem List   Diagnosis Date Noted  . Cardiac murmur 07/10/2013  . Sinus tachycardia 07/10/2013  . Screening for prostate cancer 05/02/2012  . Atypical chest pain 03/12/2012  . Left foot pain 03/12/2012  . Onychomycosis 01/01/2012  . Low back pain 10/22/2011  . Traumatic partial tear of right biceps tendon 04/26/2011  . Alcohol abuse 12/14/2010  . 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 Hyperlipidemia 06/25/2009  . OTHER NONSPECIFIC ABNORMAL SERUM ENZYME LEVELS 06/25/2009  . 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 HYPERTENSION, BENIGN SYSTEMIC 05/11/2006  . BPH 05/11/2006    Past Surgical History:  Procedure Laterality Date  . FACIAL RECONSTRUCTION SURGERY         Home Medications    Prior to Admission medications   Medication Sig Start Date End Date Taking? Authorizing Provider  lisinopril-hydrochlorothiazide (PRINZIDE) 20-12.5 MG tablet Take 1 tablet by mouth daily. 09/29/16 09/12/18  Burgess AmorIdol, Cici Rodriges, PA-C  meloxicam (MOBIC) 15 MG tablet Take 1 tablet (15 mg total) by  mouth daily. Take 1 daily with food. 08/16/16   Arthor CaptainHarris, Abigail, PA-C  predniSONE (DELTASONE) 20 MG tablet 3 tabs po daily x 3 days, then 2 tabs x 3 days, then 1.5 tabs x 3 days, then 1 tab x 3 days, then 0.5 tabs x 3 days 08/16/16   Arthor CaptainHarris, Abigail, PA-C  simvastatin (ZOCOR) 20 MG tablet Take 1 tablet (20 mg total) by mouth at bedtime. 06/15/16   Glynn Octaveancour, Stephen, MD    Family History Family History  Problem Relation Age of Onset  . Hypertension Mother   . Alcohol abuse Father   . Heart disease Father   . Stroke Father   . Cancer Father     Social History Social History   Tobacco Use  . Smoking status: Former Smoker    Years: 10.00    Types: Cigarettes    Last attempt to quit: 03/14/1993    Years since quitting: 23.8  . Smokeless tobacco: Never Used  Substance Use Topics  . Alcohol use: Yes    Comment: occasional - hx alcoholism  . Drug use: No     Allergies   Patient has no known allergies.   Review of Systems Review of Systems  Constitutional: Negative for chills and fever.  Respiratory: Negative for shortness of breath and wheezing.   Skin: Positive for wound.  Neurological: Negative for numbness.     Physical Exam Updated Vital Signs BP Marland Kitchen(!)  160/95 (BP Location: Left Arm)   Pulse 78   Temp 98.5 F (36.9 C) (Oral)   Resp 16   SpO2 97%   Physical Exam  Constitutional: He is oriented to person, place, and time. He appears well-developed and well-nourished.  HENT:  Head: Normocephalic.  Cardiovascular: Normal rate.  Pulmonary/Chest: Effort normal.  Musculoskeletal: He exhibits no tenderness.  Neurological: He is alert and oriented to person, place, and time. No sensory deficit.  Skin: Laceration noted.  0.5 cm well approximated laceration right medial forearm.  Hemostatic.     ED Treatments / Results  Labs (all labs ordered are listed, but only abnormal results are displayed) Labs Reviewed - No data to display  EKG  EKG Interpretation None        Radiology No results found.  Procedures Procedures (including critical care time)  LACERATION REPAIR Performed by: Burgess Amor Authorized by: Burgess Amor Consent: Verbal consent obtained. Risks and benefits: risks, benefits and alternatives were discussed Consent given by: patient Patient identity confirmed: provided demographic data Prepped and Draped in normal sterile fashion Wound explored  Laceration Location: right forearm  Laceration Length: 0.5 cm  No Foreign Bodies seen or palpated  Anesthesia: none  Local anesthetic: none  Anesthetic total: none Irrigation method: syringe Amount of cleaning: standard  Skin closure: dermabond  Number of sutures: na  Technique: dermabond  Patient tolerance: Patient tolerated the procedure well with no immediate complications.   Medications Ordered in ED Medications  Tdap (BOOSTRIX) injection 0.5 mL (0.5 mLs Intramuscular Given 01/17/17 1616)     Initial Impression / Assessment and Plan / ED Course  I have reviewed the triage vital signs and the nursing notes.  Pertinent labs & imaging results that were available during my care of the patient were reviewed by me and considered in my medical decision making (see chart for details).     Tetanus updated. Wound adhesive closure instructions given.  PRN f/u anticipated.  Final Clinical Impressions(s) / ED Diagnoses   Final diagnoses:  Laceration of right forearm, initial encounter    ED Discharge Orders    None       Victoriano Lain 01/17/17 1626    Donnetta Hutching, MD 01/20/17 1318

## 2017-01-17 NOTE — Discharge Instructions (Signed)
Return for any problems with your wound including redness, swelling or drainage.  Allow the glue to fall off on its own.

## 2017-01-17 NOTE — ED Notes (Signed)
Dermabond at bedside for EDPA to apply.

## 2017-01-17 NOTE — ED Triage Notes (Signed)
Pt works at [employer] and visit will be workers comp per pt. Pt cutting and blade lac right lower arm. Small quarter inch lac noted with bleeding controlled. Nad.

## 2017-06-23 ENCOUNTER — Emergency Department (HOSPITAL_COMMUNITY): Payer: Self-pay

## 2017-06-23 ENCOUNTER — Observation Stay (HOSPITAL_COMMUNITY)
Admission: EM | Admit: 2017-06-23 | Discharge: 2017-06-24 | Disposition: A | Discharge status: Home / Self Care | Payer: Self-pay | Attending: Internal Medicine | Admitting: Internal Medicine

## 2017-06-23 ENCOUNTER — Other Ambulatory Visit: Payer: Self-pay

## 2017-06-23 ENCOUNTER — Encounter (HOSPITAL_COMMUNITY): Payer: Self-pay | Admitting: Emergency Medicine

## 2017-06-23 DIAGNOSIS — F1010 Alcohol abuse, uncomplicated: Secondary | ICD-10-CM | POA: Insufficient documentation

## 2017-06-23 DIAGNOSIS — Z79899 Other long term (current) drug therapy: Secondary | ICD-10-CM | POA: Insufficient documentation

## 2017-06-23 DIAGNOSIS — R079 Chest pain, unspecified: Secondary | ICD-10-CM

## 2017-06-23 DIAGNOSIS — Z87891 Personal history of nicotine dependence: Secondary | ICD-10-CM | POA: Insufficient documentation

## 2017-06-23 DIAGNOSIS — I10 Essential (primary) hypertension: Secondary | ICD-10-CM | POA: Insufficient documentation

## 2017-06-23 DIAGNOSIS — R55 Syncope and collapse: Principal | ICD-10-CM | POA: Insufficient documentation

## 2017-06-23 LAB — D-DIMER, QUANTITATIVE: D-Dimer, Quant: 0.38 ug/mL-FEU (ref 0.00–0.50)

## 2017-06-23 LAB — CBC WITH DIFFERENTIAL/PLATELET
Basophils Absolute: 0 10*3/uL (ref 0.0–0.1)
Basophils Relative: 0 %
Eosinophils Absolute: 0.1 10*3/uL (ref 0.0–0.7)
Eosinophils Relative: 2 %
HCT: 41.2 % (ref 39.0–52.0)
Hemoglobin: 13.5 g/dL (ref 13.0–17.0)
Lymphocytes Relative: 36 %
Lymphs Abs: 1.7 10*3/uL (ref 0.7–4.0)
MCH: 30.7 pg (ref 26.0–34.0)
MCHC: 32.8 g/dL (ref 30.0–36.0)
MCV: 93.6 fL (ref 78.0–100.0)
Monocytes Absolute: 0.3 10*3/uL (ref 0.1–1.0)
Monocytes Relative: 6 %
Neutro Abs: 2.6 10*3/uL (ref 1.7–7.7)
Neutrophils Relative %: 56 %
Platelets: 255 10*3/uL (ref 150–400)
RBC: 4.4 MIL/uL (ref 4.22–5.81)
RDW: 12.6 % (ref 11.5–15.5)
WBC: 4.7 10*3/uL (ref 4.0–10.5)

## 2017-06-23 LAB — COMPREHENSIVE METABOLIC PANEL
ALT: 24 U/L (ref 17–63)
AST: 25 U/L (ref 15–41)
Albumin: 4 g/dL (ref 3.5–5.0)
Alkaline Phosphatase: 71 U/L (ref 38–126)
Anion gap: 9 (ref 5–15)
BUN: 15 mg/dL (ref 6–20)
CO2: 29 mmol/L (ref 22–32)
Calcium: 9.2 mg/dL (ref 8.9–10.3)
Chloride: 102 mmol/L (ref 101–111)
Creatinine, Ser: 0.93 mg/dL (ref 0.61–1.24)
GFR calc Af Amer: >60 mL/min (ref >60)
GFR calc non Af Amer: >60 mL/min (ref >60)
Glucose, Bld: 106 mg/dL — ABNORMAL HIGH (ref 65–99)
Potassium: 4.2 mmol/L (ref 3.5–5.1)
Sodium: 140 mmol/L (ref 135–145)
Total Bilirubin: 0.3 mg/dL (ref 0.3–1.2)
Total Protein: 7.5 g/dL (ref 6.5–8.1)

## 2017-06-23 LAB — URINALYSIS, ROUTINE W REFLEX MICROSCOPIC
Bilirubin Urine: NEGATIVE
Glucose, UA: NEGATIVE mg/dL
Hgb urine dipstick: NEGATIVE
Ketones, ur: NEGATIVE mg/dL
Leukocytes, UA: NEGATIVE
Nitrite: NEGATIVE
Protein, ur: NEGATIVE mg/dL
Specific Gravity, Urine: 1.025 (ref 1.005–1.030)
pH: 5 (ref 5.0–8.0)

## 2017-06-23 LAB — I-STAT TROPONIN, ED: Troponin i, poc: 0 ng/mL (ref 0.00–0.08)

## 2017-06-23 LAB — RAPID URINE DRUG SCREEN, HOSP PERFORMED
Amphetamines: NOT DETECTED
Barbiturates: NOT DETECTED
Benzodiazepines: NOT DETECTED
Cocaine: NOT DETECTED
Opiates: NOT DETECTED
Tetrahydrocannabinol: NOT DETECTED

## 2017-06-23 LAB — SALICYLATE LEVEL: Salicylate Lvl: <7 mg/dL (ref 2.8–30.0)

## 2017-06-23 LAB — PROTIME-INR
INR: 0.99
Prothrombin Time: 13 seconds (ref 11.4–15.2)

## 2017-06-23 LAB — CBG MONITORING, ED: Glucose-Capillary: 87 mg/dL (ref 65–99)

## 2017-06-23 LAB — ACETAMINOPHEN LEVEL: Acetaminophen (Tylenol), Serum: <10 ug/mL — ABNORMAL LOW (ref 10–30)

## 2017-06-23 LAB — ETHANOL: Alcohol, Ethyl (B): <10 mg/dL (ref <10)

## 2017-06-23 LAB — TROPONIN I: Troponin I: <0.03 ng/mL (ref <0.03)

## 2017-06-23 MED ORDER — GI COCKTAIL ~~LOC~~: 30.0000 mL | Freq: Four times a day (QID) | ORAL | Status: DC | PRN

## 2017-06-23 MED ORDER — SODIUM CHLORIDE 0.9 % IV BOLUS
500.0000 mL | Freq: Once | INTRAVENOUS | Status: AC
Start: 2017-06-23 — End: 2017-06-23
  Administered 2017-06-23: 500 mL via INTRAVENOUS

## 2017-06-23 MED ORDER — ACETAMINOPHEN 325 MG PO TABS: 650.0000 mg | ORAL_TABLET | ORAL | Status: DC | PRN

## 2017-06-23 MED ORDER — ONDANSETRON HCL 4 MG/2ML IJ SOLN: 4.0000 mg | Freq: Four times a day (QID) | INTRAMUSCULAR | Status: DC | PRN

## 2017-06-23 MED ORDER — HYDROCHLOROTHIAZIDE 12.5 MG PO CAPS
12.5000 mg | ORAL_CAPSULE | Freq: Every day | ORAL | Status: DC
  Administered 2017-06-23 – 2017-06-24 (×2): 12.5 mg via ORAL
  Filled 2017-06-23 (×2): qty 1

## 2017-06-23 MED ORDER — SIMVASTATIN 20 MG PO TABS
20.0000 mg | ORAL_TABLET | Freq: Every day | ORAL | Status: DC
  Administered 2017-06-23: 20 mg via ORAL
  Filled 2017-06-23: qty 1

## 2017-06-23 MED ORDER — LISINOPRIL-HYDROCHLOROTHIAZIDE 20-12.5 MG PO TABS: 1.0000 | ORAL_TABLET | Freq: Every day | ORAL | Status: DC

## 2017-06-23 MED ORDER — LISINOPRIL 10 MG PO TABS
20.0000 mg | ORAL_TABLET | Freq: Every day | ORAL | Status: DC
  Administered 2017-06-23 – 2017-06-24 (×2): 20 mg via ORAL
  Filled 2017-06-23 (×2): qty 2

## 2017-06-23 MED ORDER — ENOXAPARIN SODIUM 40 MG/0.4ML ~~LOC~~ SOLN
40.0000 mg | SUBCUTANEOUS | Status: DC
  Administered 2017-06-23: 40 mg via SUBCUTANEOUS
  Filled 2017-06-23: qty 0.4

## 2017-06-23 MED ORDER — SODIUM CHLORIDE 0.9 % IV SOLN
INTRAVENOUS | Status: DC
  Administered 2017-06-23: 100 mL/h via INTRAVENOUS
  Administered 2017-06-24 (×2): via INTRAVENOUS

## 2017-06-23 MED ORDER — METOPROLOL TARTRATE 25 MG PO TABS
25.0000 mg | ORAL_TABLET | Freq: Two times a day (BID) | ORAL | Status: DC
  Administered 2017-06-23 – 2017-06-24 (×2): 25 mg via ORAL
  Filled 2017-06-23 (×2): qty 1

## 2017-06-23 MED ORDER — MORPHINE SULFATE (PF) 2 MG/ML IV SOLN: 2.0000 mg | INTRAVENOUS | Status: DC | PRN

## 2017-06-23 NOTE — H&P (Signed)
History and Physical  George HawkingWillie P Rosenberry ZOX:[id number] DOB: 07/11/1965 DOA: 06/23/2017  Referring physician: Dr Clarene DukeMcManus, ED physician PCP: Patient, No Pcp Per  Outpatient Specialists: none  Patient Coming From: home  Chief Complaint: Syncope, chest pain  HPI: George Schroeder is a 52 y.o. male with a history of alcohol use, hypertension, traumatic brain injury, hyperlipidemia.  Patient seen after a syncopal episode at work.  Prior to his syncopal episode, the patient had been feeling fine although admitted to have some intermittent chest pain in the left chest over the past couple of days.  Appetite, activity level have been normal and had no ill feelings.  After syncopal episode, patient complained of chest pain.  Upon EMS arrival, the patient received nitroglycerin and baby aspirin with good improvement of the patient's pain.  He does feel a little nauseated, though feels like he wants to eat.  No chest pain currently.  No shortness of breath, weakness, abdominal pain.  He denies alcohol use over the past couple of days.  In regards to the syncopal episode, patient describes it as the room spinning and closing in with his vision becoming gray.  He has never had any episodes similar to this in the past.  Emergency Department Course: Workup including CT, CMP, CBC, UA, urine drug screen has been normal.  Troponin is still pending at this point.  Review of Systems:   Pt denies any fevers, chills, vomiting, diarrhea, constipation, abdominal pain, shortness of breath, dyspnea on exertion, orthopnea, cough, wheezing, palpitations, headache, vision changes, lightheadedness, dizziness, melena, rectal bleeding.  Review of systems are otherwise negative  Past Medical History:  Diagnosis Date  . Alcohol abuse   . Anxiety   . Essential hypertension, benign   . Hyperlipidemia   . TBI (traumatic brain injury) [facility](HCC) age 52   Past Surgical History:  Procedure Laterality Date  . FACIAL RECONSTRUCTION  SURGERY     Social History:  reports that he quit smoking about 24 years ago. His smoking use included cigarettes. He quit after 10.00 years of use. He has never used smokeless tobacco. He reports that he drinks alcohol. He reports that he does not use drugs. Patient lives at home  No Known Allergies  Family History  Problem Relation Age of Onset  . Hypertension Mother   . Alcohol abuse Father   . Heart disease Father   . Stroke Father   . Cancer Father       Prior to Admission medications   Medication Sig Start Date End Date Taking? Authorizing Provider  lisinopril-hydrochlorothiazide (PRINZIDE) 20-12.5 MG tablet Take 1 tablet by mouth daily. 09/29/16 09/12/18 Yes Idol, Raynelle FanningJulie, PA-C  meloxicam (MOBIC) 15 MG tablet Take 1 tablet (15 mg total) by mouth daily. Take 1 daily with food. 08/16/16  Yes Harris, Abigail, PA-C  simvastatin (ZOCOR) 20 MG tablet Take 1 tablet (20 mg total) by mouth at bedtime. 06/15/16  Yes Rancour, Jeannett SeniorStephen, MD    Physical Exam: BP (!) 130/99   Pulse 80   Temp 98.5 F (36.9 C) (Oral)   Resp (!) 25   Ht 5\' 7"  (1.702 m)   Wt 72.6 kg (160 lb)   SpO2 99%   BMI 25.06 kg/m   . General: Middle-aged black male. Awake and alert and oriented x3. No acute cardiopulmonary distress.  Marland Kitchen. HEENT: Normocephalic atraumatic.  Right and left ears normal in appearance.  Pupils equal, round, reactive to light. Extraocular muscles are intact. Sclerae anicteric and noninjected.  Moist mucosal  membranes. No mucosal lesions.  . Neck: Neck supple without lymphadenopathy. No carotid bruits. No masses palpated.  . Cardiovascular: Regular rate with normal S1-S2 sounds.  2 out of 6 systolic murmur. No rubs, gallops auscultated. No JVD.  Marland Kitchen Respiratory: Good respiratory effort with no wheezes, rales, rhonchi. Lungs clear to auscultation bilaterally.  No accessory muscle use. . Abdomen: Soft, nontender, nondistended. Active bowel sounds. No masses or hepatosplenomegaly  . Skin: No rashes,  lesions, or ulcerations.  Dry, warm to touch. 2+ dorsalis pedis and radial pulses. . Musculoskeletal: No calf or leg pain. All major joints not erythematous nontender.  No upper or lower joint deformation.  Good ROM.  No contractures  . Psychiatric: Intact judgment and insight. Pleasant and cooperative. . Neurologic: No focal neurological deficits. Strength is 5/5 and symmetric in upper and lower extremities.  Cranial nerves II through XII are grossly intact.           Labs on Admission: I have personally reviewed following labs and imaging studies  CBC: Recent Labs  Lab 06/23/17 1330  WBC 4.7  NEUTROABS 2.6  HGB 13.5  HCT 41.2  MCV 93.6  PLT 255   Basic Metabolic Panel: Recent Labs  Lab 06/23/17 1330  NA 140  K 4.2  CL 102  CO2 29  GLUCOSE 106*  BUN 15  CREATININE 0.93  CALCIUM 9.2   GFR: Estimated Creatinine Clearance: 85.9 mL/min (by C-G formula based on SCr of 0.93 mg/dL). Liver Function Tests: Recent Labs  Lab 06/23/17 1330  AST 25  ALT 24  ALKPHOS 71  BILITOT 0.3  PROT 7.5  ALBUMIN 4.0   No results for input(s): LIPASE, AMYLASE in the last 168 hours. No results for input(s): AMMONIA in the last 168 hours. Coagulation Profile: Recent Labs  Lab 06/23/17 1330  INR 0.99   Cardiac Enzymes: No results for input(s): CKTOTAL, CKMB, CKMBINDEX, TROPONINI in the last 168 hours. BNP (last 3 results) No results for input(s): PROBNP in the last 8760 hours. HbA1C: No results for input(s): HGBA1C in the last 72 hours. CBG: Recent Labs  Lab 06/23/17 1503  GLUCAP 87   Lipid Profile: No results for input(s): CHOL, HDL, LDLCALC, TRIG, CHOLHDL, LDLDIRECT in the last 72 hours. Thyroid Function Tests: No results for input(s): TSH, T4TOTAL, FREET4, T3FREE, THYROIDAB in the last 72 hours. Anemia Panel: No results for input(s): VITAMINB12, FOLATE, FERRITIN, TIBC, IRON, RETICCTPCT in the last 72 hours. Urine analysis:    Component Value Date/Time   COLORURINE  YELLOW 06/23/2017 1506   APPEARANCEUR CLEAR 06/23/2017 1506   LABSPEC 1.025 06/23/2017 1506   PHURINE 5.0 06/23/2017 1506   GLUCOSEU NEGATIVE 06/23/2017 1506   HGBUR NEGATIVE 06/23/2017 1506   HGBUR negative 09/19/2006 1330   BILIRUBINUR NEGATIVE 06/23/2017 1506   KETONESUR NEGATIVE 06/23/2017 1506   PROTEINUR NEGATIVE 06/23/2017 1506   UROBILINOGEN 1.0 11/29/2010 0605   NITRITE NEGATIVE 06/23/2017 1506   LEUKOCYTESUR NEGATIVE 06/23/2017 1506   Sepsis Labs: @LABRCNTIP (procalcitonin:4,lacticidven:4) )No results found for this or any previous visit (from the past 240 hour(s)).   Radiological Exams on Admission: Dg Chest 2 View  Result Date: 06/23/2017 CLINICAL DATA:  52 y/o M; syncopal episode with loss of consciousness. Two days of chest pain. EXAM: CHEST - 2 VIEW COMPARISON:  06/14/2016 chest radiograph.  06/15/2016 chest CT. FINDINGS: Stable heart size and mediastinal contours are within normal limits. Both lungs are clear. The visualized skeletal structures are unremarkable. IMPRESSION: No acute pulmonary process identified. Electronically Signed  By: Mitzi Hansen M.D.   On: 06/23/2017 14:06   Ct Head Wo Contrast  Result Date: 06/23/2017 CLINICAL DATA:  Syncopal episode EXAM: CT HEAD WITHOUT CONTRAST TECHNIQUE: Contiguous axial images were obtained from the base of the skull through the vertex without intravenous contrast. COMPARISON:  05/27/2004 FINDINGS: Brain: No evidence of acute infarction, hemorrhage, hydrocephalus, extra-axial collection or mass lesion/mass effect. Vascular: No hyperdense vessel or unexpected calcification. Skull: Normal. Negative for fracture or focal lesion. Sinuses/Orbits: No acute finding. Other: Chronic degenerative changes of left TMJ joint are noted. This is likely posttraumatic in nature. IMPRESSION: No acute intracranial abnormality noted. Electronically Signed   By: Alcide Clever M.D.   On: 06/23/2017 13:54    EKG: Independently reviewed.   Normal sinus rhythm, LVH.  Unchanged from previous.  No acute ST elevation  Assessment/Plan: Active Problems:   HYPERTENSION, BENIGN SYSTEMIC   Alcohol abuse   Chest pain   Syncope and collapse    This patient was discussed with the ED physician, including pertinent vitals, physical exam findings, labs, and imaging.  We also discussed care given by the ED provider.  1. Syncope a. Likely vasovagal b. Observation on telemetry c. Echocardiogram d. Serial troponins e. Continuous telemetry monitoring 2. Chest pain a. Likely noncardiac, however due to syncope, will rule out b. Serial troponins c. Telemetry monitoring 3. Alcohol use a. Patient denies current use.  Will observe closely 4. Hypertension a. Restart home medications   DVT prophylaxis: Lovenox Consultants: None Code Status: Full code Family Communication: None Disposition Plan: Patient to return home following echocardiogram and rule out   Levie Heritage, DO [facility] Pager [phone]  If 7PM-7AM, please contact night-coverage [email or web address] Password [email or web address]

## 2017-06-23 NOTE — ED Provider Notes (Signed)
[facility] EMERGENCY DEPARTMENT Provider Note   CSN: [id number] Arrival date & time: 06/23/17  1307     History   Chief Complaint Chief Complaint  Patient presents with  . Loss of Consciousness  . Chest Pain    HPI George Schroeder is a 52 y.o. male.  The history is provided by the patient and the EMS personnel. The history is limited by the condition of the patient (Hx TBI).  Loss of Consciousness   Associated symptoms include chest pain.  Chest Pain   Associated symptoms include syncope.  Pt was seen at 1325. Per EMS and pt report: Pt states he was walking down a hallway at work, when "the hallway moved" and he "went out." Bystanders state pt had a syncopal episode, hitting his head on the floor. Pt also c/o chest "pain" when EMS arrived to scene. Pt was given ASA and SL ntg with improvement of his CP. Pt states he also had chest "pains" yesterday which lasted several minutes before resolving spontaneously. Denies hx of similar symptoms. Denies palpitations, no SOB/cough, no abd pain, no N/V/D, no neck or back pain, no visual changes, no focal motor weakness, no tingling/numbness in extremities, no ataxia, no slurred speech, no facial droop, no reported seizure activity, no incont of bowel/bladder.    Past Medical History:  Diagnosis Date  . Alcohol abuse   . Anxiety   . Essential hypertension, benign   . Hyperlipidemia   . TBI (traumatic brain injury) [facility]) age 68    Patient Active Problem List   Diagnosis Date Noted  . Cardiac murmur 07/10/2013  . Sinus tachycardia 07/10/2013  . Screening for prostate cancer 05/02/2012  . Atypical chest pain 03/12/2012  . Left foot pain 03/12/2012  . Onychomycosis 01/01/2012  . Low back pain 10/22/2011  . Traumatic partial tear of right biceps tendon 04/26/2011  . Alcohol abuse 12/14/2010  . 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 Hyperlipidemia 06/25/2009  . OTHER NONSPECIFIC ABNORMAL SERUM ENZYME LEVELS  06/25/2009  . 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 HYPERTENSION, BENIGN SYSTEMIC 05/11/2006  . BPH 05/11/2006    Past Surgical History:  Procedure Laterality Date  . FACIAL RECONSTRUCTION SURGERY          Home Medications    Prior to Admission medications   Medication Sig Start Date End Date Taking? Authorizing Provider  lisinopril-hydrochlorothiazide (PRINZIDE) 20-12.5 MG tablet Take 1 tablet by mouth daily. 09/29/16 09/12/18 Yes Idol, Raynelle Fanning, PA-C  meloxicam (MOBIC) 15 MG tablet Take 1 tablet (15 mg total) by mouth daily. Take 1 daily with food. 08/16/16  Yes Harris, Abigail, PA-C  simvastatin (ZOCOR) 20 MG tablet Take 1 tablet (20 mg total) by mouth at bedtime. 06/15/16  Yes Rancour, Jeannett Senior, MD    Family History Family History  Problem Relation Age of Onset  . Hypertension Mother   . Alcohol abuse Father   . Heart disease Father   . Stroke Father   . Cancer Father     Social History Social History   Tobacco Use  . Smoking status: Former Smoker    Years: 10.00    Types: Cigarettes    Last attempt to quit: 03/14/1993    Years since quitting: 24.2  . Smokeless tobacco: Never Used  Substance Use Topics  . Alcohol use: Yes    Comment: occasional - hx alcoholism  . Drug use: No     Allergies   Patient has no known allergies.   Review of Systems  Review of Systems  Unable to perform ROS: Other  Cardiovascular: Positive for chest pain and syncope.    Physical Exam Updated Vital Signs BP (!) 130/99   Pulse 80   Temp 98.5 F (36.9 C) (Oral)   Resp (!) 25   Ht 5\' 7"  (1.702 m)   Wt 72.6 kg (160 lb)   SpO2 99%   BMI 25.06 kg/m    15:05 Orthostatic Vital Signs LA  Orthostatic Lying   BP- Lying: 155/100Abnormal    Pulse- Lying: 94       Orthostatic Sitting  BP- Sitting: 175/98Abnormal    Pulse- Sitting: 100       Orthostatic Standing at 0 minutes  BP- Standing at 0 minutes: 160/110Abnormal    Pulse- Standing at 0 minutes: 103     Physical  Exam 1330: Physical examination:  Nursing notes reviewed; Vital signs and O2 SAT reviewed;  Constitutional: Well developed, Well nourished, Well hydrated, In no acute distress; Head:  Normocephalic, atraumatic; Eyes: EOMI, PERRL, No scleral icterus; ENMT: Mouth and pharynx normal, Mucous membranes moist; Neck: Supple, Full range of motion, No lymphadenopathy; Cardiovascular: Regular rate and rhythm, No gallop; Respiratory: Breath sounds clear & equal bilaterally, No wheezes.  Speaking full sentences with ease, Normal respiratory effort/excursion; Chest: Nontender, Movement normal; Abdomen: Soft, Nontender, Nondistended, Normal bowel sounds; Genitourinary: No CVA tenderness; Extremities: Peripheral pulses normal, No tenderness, No edema, No calf edema or asymmetry.; Neuro: AA&Ox3, Major CN grossly intact. No facial droop. Speech clear. Grips equal. Strength 5/5 equal bilat UE's and LE's. No gross focal motor or sensory deficits in extremities.; Skin: Color normal, Warm, Dry.   ED Treatments / Results  Labs (all labs ordered are listed, but only abnormal results are displayed)   EKG EKG Interpretation  Date/Time:  Friday June 23 2017 13:14:29 EDT Ventricular Rate:  78 PR Interval:    QRS Duration: 98 QT Interval:  397 QTC Calculation: 453 R Axis:   -17 Text Interpretation:  Sinus rhythm Left axis deviation LVH with secondary repolarization abnormality When compared with ECG of 06/14/2016 Rate slower Otherwise no significant change Confirmed by Samuel JesterMcManus, Wanna Gully [phone]) on 06/23/2017 1:19:24 PM   Radiology   Procedures Procedures (including critical care time)  Medications Ordered in ED Medications  sodium chloride 0.9 % bolus 500 mL (has no administration in time range)  0.9 %  sodium chloride infusion (has no administration in time range)     Initial Impression / Assessment and Plan / ED Course  I have reviewed the triage vital signs and the nursing notes.  Pertinent labs &  imaging results that were available during my care of the patient were reviewed by me and considered in my medical decision making (see chart for details).  MDM Reviewed: previous chart, nursing note and vitals Reviewed previous: labs and ECG Interpretation: labs, ECG, x-ray and CT scan   Results for orders placed or performed during the hospital encounter of 06/23/17  CBC with Differential  Result Value Ref Range   WBC 4.7 4.0 - 10.5 K/uL   RBC 4.40 4.22 - 5.81 MIL/uL   Hemoglobin 13.5 13.0 - 17.0 g/dL   HCT 60.441.2 54.039.0 - 98.152.0 %   MCV 93.6 78.0 - 100.0 fL   MCH 30.7 26.0 - 34.0 pg   MCHC 32.8 30.0 - 36.0 g/dL   RDW 19.112.6 47.811.5 - 29.515.5 %   Platelets 255 150 - 400 K/uL   Neutrophils Relative % 56 %   Neutro Abs 2.6 1.7 -  7.7 K/uL   Lymphocytes Relative 36 %   Lymphs Abs 1.7 0.7 - 4.0 K/uL   Monocytes Relative 6 %   Monocytes Absolute 0.3 0.1 - 1.0 K/uL   Eosinophils Relative 2 %   Eosinophils Absolute 0.1 0.0 - 0.7 K/uL   Basophils Relative 0 %   Basophils Absolute 0.0 0.0 - 0.1 K/uL  D-dimer, quantitative  Result Value Ref Range   D-Dimer, Quant 0.38 0.00 - 0.50 ug/mL-FEU  Comprehensive metabolic panel  Result Value Ref Range   Sodium 140 135 - 145 mmol/L   Potassium 4.2 3.5 - 5.1 mmol/L   Chloride 102 101 - 111 mmol/L   CO2 29 22 - 32 mmol/L   Glucose, Bld 106 (H) 65 - 99 mg/dL   BUN 15 6 - 20 mg/dL   Creatinine, Ser 1.61 0.61 - 1.24 mg/dL   Calcium 9.2 8.9 - 09.6 mg/dL   Total Protein 7.5 6.5 - 8.1 g/dL   Albumin 4.0 3.5 - 5.0 g/dL   AST 25 15 - 41 U/L   ALT 24 17 - 63 U/L   Alkaline Phosphatase 71 38 - 126 U/L   Total Bilirubin 0.3 0.3 - 1.2 mg/dL   GFR calc non Af Amer >60 >60 mL/min   GFR calc Af Amer >60 >60 mL/min   Anion gap 9 5 - 15  Ethanol  Result Value Ref Range   Alcohol, Ethyl (B) <10 <10 mg/dL  Protime-INR  Result Value Ref Range   Prothrombin Time 13.0 11.4 - 15.2 seconds   INR 0.99   Salicylate level  Result Value Ref Range   Salicylate Lvl  <7.0 2.8 - 30.0 mg/dL  Acetaminophen level  Result Value Ref Range   Acetaminophen (Tylenol), Serum <10 (L) 10 - 30 ug/mL  CBG monitoring, ED  Result Value Ref Range   Glucose-Capillary 87 65 - 99 mg/dL  I-stat troponin: 0.45   Dg Chest 2 View Result Date: 06/23/2017 CLINICAL DATA:  52 y/o M; syncopal episode with loss of consciousness. Two days of chest pain. EXAM: CHEST - 2 VIEW COMPARISON:  06/14/2016 chest radiograph.  06/15/2016 chest CT. FINDINGS: Stable heart size and mediastinal contours are within normal limits. Both lungs are clear. The visualized skeletal structures are unremarkable. IMPRESSION: No acute pulmonary process identified. Electronically Signed   By: Mitzi Hansen M.D.   On: 06/23/2017 14:06   Ct Head Wo Contrast Result Date: 06/23/2017 CLINICAL DATA:  Syncopal episode EXAM: CT HEAD WITHOUT CONTRAST TECHNIQUE: Contiguous axial images were obtained from the base of the skull through the vertex without intravenous contrast. COMPARISON:  05/27/2004 FINDINGS: Brain: No evidence of acute infarction, hemorrhage, hydrocephalus, extra-axial collection or mass lesion/mass effect. Vascular: No hyperdense vessel or unexpected calcification. Skull: Normal. Negative for fracture or focal lesion. Sinuses/Orbits: No acute finding. Other: Chronic degenerative changes of left TMJ joint are noted. This is likely posttraumatic in nature. IMPRESSION: No acute intracranial abnormality noted. Electronically Signed   By: Alcide Clever M.D.   On: 06/23/2017 13:54    1520:  Pt is not orthostatic on VS. Concerning HPI; will admit. Dx and testing d/w pt and family.  Questions answered.  Verb understanding, agreeable to admit.  T/C returned from [facility] Adrian Blackwater, case discussed, including:  HPI, pertinent PM/SHx, VS/PE, dx testing, ED course and treatment:  Agreeable to admit.    Final Clinical Impressions(s) / ED Diagnoses   Final diagnoses:  Syncope and collapse    ED Discharge  Orders  None       Samuel Jester, DO 06/24/17 1453

## 2017-06-23 NOTE — ED Triage Notes (Signed)
Pt was at work walking down a hall and felt like the room was spinning and had a syncopal episode, hit his head on the floor. LOC, denies head pain at this time. C/o CP yesterday and today, EMS given 0.4 nitro and 324 ASA PTA. Denies CP at this time.

## 2017-06-24 ENCOUNTER — Observation Stay (HOSPITAL_BASED_OUTPATIENT_CLINIC_OR_DEPARTMENT_OTHER): Payer: Self-pay

## 2017-06-24 DIAGNOSIS — R079 Chest pain, unspecified: Secondary | ICD-10-CM

## 2017-06-24 DIAGNOSIS — R55 Syncope and collapse: Secondary | ICD-10-CM

## 2017-06-24 LAB — TROPONIN I
Troponin I: <0.03 ng/mL (ref <0.03)
Troponin I: <0.03 ng/mL (ref <0.03)

## 2017-06-24 LAB — ECHOCARDIOGRAM COMPLETE
Height: 67 in
Weight: 2560 oz

## 2017-06-24 LAB — HIV ANTIBODY (ROUTINE TESTING W REFLEX): HIV Screen 4th Generation wRfx: NONREACTIVE

## 2017-06-24 MED ORDER — SIMVASTATIN 20 MG PO TABS: 20.0000 mg | ORAL_TABLET | Freq: Every day | ORAL | 3 refills | Status: DC

## 2017-06-24 MED ORDER — LISINOPRIL-HYDROCHLOROTHIAZIDE 20-12.5 MG PO TABS: 1.0000 | ORAL_TABLET | Freq: Every day | ORAL | 3 refills | Status: DC

## 2017-06-24 MED ORDER — METOPROLOL TARTRATE 25 MG PO TABS: 25.0000 mg | ORAL_TABLET | Freq: Two times a day (BID) | ORAL | 2 refills | Status: DC

## 2017-06-24 NOTE — Progress Notes (Signed)
SATURATION QUALIFICATIONS: (This note is used to comply with regulatory documentation for home oxygen)  Patient Saturations on Room Air at Rest = 99%  Patient Saturations on Room Air while Ambulating = 90%  Patient Saturations on 2 Liters of oxygen while Ambulating = n/a  Please briefly explain why patient needs home oxygen: Patient did not require oxygen while ambulating.  Quita SkyeMorgan P Dishmon, RN

## 2017-06-24 NOTE — Progress Notes (Signed)
*  PRELIMINARY RESULTS* Echocardiogram 2D Echocardiogram has been performed.  Stacey DrainWhite, Mita Vallo J 06/24/2017, 10:58 AM

## 2017-06-24 NOTE — Discharge Summary (Signed)
Physician Discharge Summary  George Schroeder EAV:[id number] DOB: 06-Jan-1966 DOA: 06/23/2017  PCP: Patient, No Pcp Per  Admit date: 06/23/2017  Discharge date: 06/24/2017  Admitted From:Home  Disposition:  Home  Recommendations for Outpatient Follow-up:  1. Follow up with a new PCP in 1-2 weeks to check BP and review 2D echo results  Home Health:N/A  Equipment/Devices:N/A  Discharge Condition:Stable  CODE STATUS: Full  Diet recommendation: Heart Healthy  Brief/Interim Summary:  George Schroeder is a 52 y.o. male with a history of alcohol use, hypertension, traumatic brain injury, hyperlipidemia.  Patient seen after a syncopal episode at work.  He was noted to have symptoms that were vasovagal in nature.  He had also run out of his blood pressure medications at home as he is in the process of switching his primary care provider.  EKG demonstrates normal sinus rhythm with some LVH and no other acute findings.  2D echocardiogram has been performed with read currently pending and this needs to be reviewed by future new primary care provider.  Serial troponins did not demonstrate any acute coronary syndrome and patient denies any further symptomatology.  He did initially have some chest pain which has spontaneously resolved with no findings of ACS.  He has been prescribed refills on his home blood pressure medications as well as a statin to continue taking and he agrees to follow-up with new primary care provider in 2-4 weeks.   Discharge Diagnoses:  Active Problems:   HYPERTENSION, BENIGN SYSTEMIC   Alcohol abuse   Chest pain   Syncope and collapse  1. Syncope likely vasovagal.  Telemetry monitoring as well as serial troponins with no acute findings.  EKG with some mild LVH.  2D echocardiogram has been performed and will need further review in the outpatient setting.  No further symptomatology with ambulation at this time and does not require home oxygen. 2. Atypical chest pain.  Troponins  have been flat with no findings on telemetry.  No further chest pain at this time. 3. Hypertension.  Home medications refilled as patient had run out.  Blood pressure has remained stable.  Discharge Instructions  Discharge Instructions    Diet - low sodium heart healthy   Complete by:  As directed    Increase activity slowly   Complete by:  As directed      Allergies as of 06/24/2017   No Known Allergies     Medication List    TAKE these medications   lisinopril-hydrochlorothiazide 20-12.5 MG tablet Commonly known as:  PRINZIDE Take 1 tablet by mouth daily.   meloxicam 15 MG tablet Commonly known as:  MOBIC Take 1 tablet (15 mg total) by mouth daily. Take 1 daily with food.   metoprolol tartrate 25 MG tablet Commonly known as:  LOPRESSOR Take 1 tablet (25 mg total) by mouth 2 (two) times daily.   simvastatin 20 MG tablet Commonly known as:  ZOCOR Take 1 tablet (20 mg total) by mouth at bedtime.      Follow-up Information    pcp Follow up in 2 week(s).   Why:  Pt to find pcp to follow up with for 2 week appt. and echocardiogram follow up.         No Known Allergies  Consultations:  None   Procedures/Studies: Dg Chest 2 View  Result Date: 06/23/2017 CLINICAL DATA:  52 y/o M; syncopal episode with loss of consciousness. Two days of chest pain. EXAM: CHEST - 2 VIEW COMPARISON:  06/14/2016 chest radiograph.  06/15/2016 chest CT. FINDINGS: Stable heart size and mediastinal contours are within normal limits. Both lungs are clear. The visualized skeletal structures are unremarkable. IMPRESSION: No acute pulmonary process identified. Electronically Signed   By: Mitzi HansenLance  Furusawa-Stratton M.D.   On: 06/23/2017 14:06   Ct Head Wo Contrast  Result Date: 06/23/2017 CLINICAL DATA:  Syncopal episode EXAM: CT HEAD WITHOUT CONTRAST TECHNIQUE: Contiguous axial images were obtained from the base of the skull through the vertex without intravenous contrast. COMPARISON:  05/27/2004  FINDINGS: Brain: No evidence of acute infarction, hemorrhage, hydrocephalus, extra-axial collection or mass lesion/mass effect. Vascular: No hyperdense vessel or unexpected calcification. Skull: Normal. Negative for fracture or focal lesion. Sinuses/Orbits: No acute finding. Other: Chronic degenerative changes of left TMJ joint are noted. This is likely posttraumatic in nature. IMPRESSION: No acute intracranial abnormality noted. Electronically Signed   By: Alcide CleverMark  Lukens M.D.   On: 06/23/2017 13:54   Discharge Exam: Vitals:   06/24/17 0825 06/24/17 1136  BP: 113/80   Pulse: 66   Resp: 17   Temp: 97.7 F (36.5 C)   SpO2: 100% 97%   Vitals:   06/24/17 0027 06/24/17 0425 06/24/17 0825 06/24/17 1136  BP: 119/83 119/84 113/80   Pulse: 61 (!) 58 66   Resp: 18 16 17    Temp: 97.9 F (36.6 C) (!) 97.5 F (36.4 C) 97.7 F (36.5 C)   TempSrc: Oral  Oral   SpO2: 98% 99% 100% 97%  Weight:      Height:        General: Pt is alert, awake, not in acute distress Cardiovascular: RRR, S1/S2 +, no rubs, no gallops Respiratory: CTA bilaterally, no wheezing, no rhonchi Abdominal: Soft, NT, ND, bowel sounds + Extremities: no edema, no cyanosis    The results of significant diagnostics from this hospitalization (including imaging, microbiology, ancillary and laboratory) are listed below for reference.     Microbiology: No results found for this or any previous visit (from the past 240 hour(s)).   Labs: BNP (last 3 results) No results for input(s): BNP in the last 8760 hours. Basic Metabolic Panel: Recent Labs  Lab 06/23/17 1330  NA 140  K 4.2  CL 102  CO2 29  GLUCOSE 106*  BUN 15  CREATININE 0.93  CALCIUM 9.2   Liver Function Tests: Recent Labs  Lab 06/23/17 1330  AST 25  ALT 24  ALKPHOS 71  BILITOT 0.3  PROT 7.5  ALBUMIN 4.0   No results for input(s): LIPASE, AMYLASE in the last 168 hours. No results for input(s): AMMONIA in the last 168 hours. CBC: Recent Labs  Lab  06/23/17 1330  WBC 4.7  NEUTROABS 2.6  HGB 13.5  HCT 41.2  MCV 93.6  PLT 255   Cardiac Enzymes: Recent Labs  Lab 06/23/17 1817 06/24/17 0017 06/24/17 0641  TROPONINI <0.03 <0.03 <0.03   BNP: Invalid input(s): POCBNP CBG: Recent Labs  Lab 06/23/17 1503  GLUCAP 87   D-Dimer Recent Labs    06/23/17 1330  DDIMER 0.38   Hgb A1c No results for input(s): HGBA1C in the last 72 hours. Lipid Profile No results for input(s): CHOL, HDL, LDLCALC, TRIG, CHOLHDL, LDLDIRECT in the last 72 hours. Thyroid function studies No results for input(s): TSH, T4TOTAL, T3FREE, THYROIDAB in the last 72 hours.  Invalid input(s): FREET3 Anemia work up No results for input(s): VITAMINB12, FOLATE, FERRITIN, TIBC, IRON, RETICCTPCT in the last 72 hours. Urinalysis    Component Value Date/Time   COLORURINE YELLOW 06/23/2017 1506  APPEARANCEUR CLEAR 06/23/2017 1506   LABSPEC 1.025 06/23/2017 1506   PHURINE 5.0 06/23/2017 1506   GLUCOSEU NEGATIVE 06/23/2017 1506   HGBUR NEGATIVE 06/23/2017 1506   HGBUR negative 09/19/2006 1330   BILIRUBINUR NEGATIVE 06/23/2017 1506   KETONESUR NEGATIVE 06/23/2017 1506   PROTEINUR NEGATIVE 06/23/2017 1506   UROBILINOGEN 1.0 11/29/2010 0605   NITRITE NEGATIVE 06/23/2017 1506   LEUKOCYTESUR NEGATIVE 06/23/2017 1506   Sepsis Labs Invalid input(s): PROCALCITONIN,  WBC,  LACTICIDVEN Microbiology No results found for this or any previous visit (from the past 240 hour(s)).   Time coordinating discharge: 35 minutes  SIGNED:   Erick Blinks, DO [facility] 06/24/2017, 1:30 PM Pager [phone]  If 7PM-7AM, please contact night-coverage [email or web address] Password [email or web address]

## 2017-06-24 NOTE — Progress Notes (Signed)
Patient is to be discharged home and in stable condition. Patient's IV and telemetry removed, WNL. Patient given discharge instructions and verbalized understanding. Patient will be escorted out by staff via wheelchair upon arrival of transportation.  Vikram Tillett P Dishmon, RN  

## 2017-09-06 ENCOUNTER — Other Ambulatory Visit: Payer: Self-pay

## 2017-09-06 ENCOUNTER — Emergency Department (HOSPITAL_COMMUNITY): Payer: Self-pay

## 2017-09-06 ENCOUNTER — Emergency Department (HOSPITAL_COMMUNITY)
Admission: EM | Admit: 2017-09-06 | Discharge: 2017-09-06 | Disposition: A | Discharge status: Home / Self Care | Payer: Self-pay | Attending: Emergency Medicine | Admitting: Emergency Medicine

## 2017-09-06 ENCOUNTER — Encounter (HOSPITAL_COMMUNITY): Payer: Self-pay | Admitting: Emergency Medicine

## 2017-09-06 DIAGNOSIS — Z79899 Other long term (current) drug therapy: Secondary | ICD-10-CM | POA: Insufficient documentation

## 2017-09-06 DIAGNOSIS — T7840XA Allergy, unspecified, initial encounter: Secondary | ICD-10-CM | POA: Insufficient documentation

## 2017-09-06 DIAGNOSIS — Z87891 Personal history of nicotine dependence: Secondary | ICD-10-CM | POA: Insufficient documentation

## 2017-09-06 DIAGNOSIS — I10 Essential (primary) hypertension: Secondary | ICD-10-CM | POA: Insufficient documentation

## 2017-09-06 DIAGNOSIS — Z87820 Personal history of traumatic brain injury: Secondary | ICD-10-CM | POA: Insufficient documentation

## 2017-09-06 DIAGNOSIS — R0789 Other chest pain: Secondary | ICD-10-CM | POA: Insufficient documentation

## 2017-09-06 LAB — TROPONIN I: Troponin I: <0.03 ng/mL (ref <0.03)

## 2017-09-06 LAB — CBC WITH DIFFERENTIAL/PLATELET
Basophils Absolute: 0 10*3/uL (ref 0.0–0.1)
Basophils Relative: 0 %
Eosinophils Absolute: 0.1 10*3/uL (ref 0.0–0.7)
Eosinophils Relative: 2 %
HCT: 40.3 % (ref 39.0–52.0)
Hemoglobin: 13.4 g/dL (ref 13.0–17.0)
Lymphocytes Relative: 22 %
Lymphs Abs: 1.5 10*3/uL (ref 0.7–4.0)
MCH: 31.5 pg (ref 26.0–34.0)
MCHC: 33.3 g/dL (ref 30.0–36.0)
MCV: 94.8 fL (ref 78.0–100.0)
Monocytes Absolute: 0.5 10*3/uL (ref 0.1–1.0)
Monocytes Relative: 7 %
Neutro Abs: 4.6 10*3/uL (ref 1.7–7.7)
Neutrophils Relative %: 69 %
Platelets: 272 10*3/uL (ref 150–400)
RBC: 4.25 MIL/uL (ref 4.22–5.81)
RDW: 12.9 % (ref 11.5–15.5)
WBC: 6.7 10*3/uL (ref 4.0–10.5)

## 2017-09-06 LAB — COMPREHENSIVE METABOLIC PANEL
ALT: 20 U/L (ref 0–44)
AST: 24 U/L (ref 15–41)
Albumin: 4.2 g/dL (ref 3.5–5.0)
Alkaline Phosphatase: 71 U/L (ref 38–126)
Anion gap: 9 (ref 5–15)
BUN: 14 mg/dL (ref 6–20)
CO2: 29 mmol/L (ref 22–32)
Calcium: 9.1 mg/dL (ref 8.9–10.3)
Chloride: 103 mmol/L (ref 98–111)
Creatinine, Ser: 0.99 mg/dL (ref 0.61–1.24)
GFR calc Af Amer: >60 mL/min (ref >60)
GFR calc non Af Amer: >60 mL/min (ref >60)
Glucose, Bld: 85 mg/dL (ref 70–99)
Potassium: 3.7 mmol/L (ref 3.5–5.1)
Sodium: 141 mmol/L (ref 135–145)
Total Bilirubin: 0.2 mg/dL — ABNORMAL LOW (ref 0.3–1.2)
Total Protein: 7.9 g/dL (ref 6.5–8.1)

## 2017-09-06 MED ORDER — PREDNISONE 20 MG PO TABS: ORAL_TABLET | ORAL | 0 refills | Status: DC

## 2017-09-06 MED ORDER — SODIUM CHLORIDE 0.9 % IV BOLUS
500.0000 mL | Freq: Once | INTRAVENOUS | Status: AC
  Administered 2017-09-06: 500 mL via INTRAVENOUS

## 2017-09-06 MED ORDER — METHYLPREDNISOLONE SODIUM SUCC 125 MG IJ SOLR
125.0000 mg | Freq: Once | INTRAMUSCULAR | Status: AC
  Administered 2017-09-06: 125 mg via INTRAVENOUS
  Filled 2017-09-06: qty 2

## 2017-09-06 MED ORDER — DIPHENHYDRAMINE HCL 50 MG/ML IJ SOLN
25.0000 mg | Freq: Once | INTRAMUSCULAR | Status: AC
  Administered 2017-09-06: 25 mg via INTRAVENOUS
  Filled 2017-09-06: qty 1

## 2017-09-06 NOTE — Discharge Instructions (Addendum)
Take Benadryl for itching.  Follow-up next week if not improving

## 2017-09-06 NOTE — ED Notes (Signed)
Pt returned from X-ray.  

## 2017-09-06 NOTE — ED Triage Notes (Signed)
CP on and off since yesterday. Red raised itchy rash throughout body since yesterday.

## 2017-09-06 NOTE — ED Provider Notes (Signed)
[facility] EMERGENCY DEPARTMENT Provider Note   CSN: [id number] Arrival date & time: 09/06/17  1412     History   Chief Complaint Chief Complaint  Patient presents with  . Chest Pain    HPI Jeani HawkingWillie P Hogeland is a 52 y.o. male.  Patient states he was bit by insect on the arm and developed a rash to his arms and chest that is pruritic.  This been going on for couple days.  Patient also has some chest discomfort  The history is provided by the patient. No language interpreter was used.  Chest Pain   This is a new problem. The current episode started 6 to 12 hours ago. The problem occurs rarely. The problem has been resolved. The pain is associated with movement. The pain is present in the lateral region. The pain is at a severity of 5/10. The pain is moderate. The quality of the pain is described as sharp. The pain does not radiate. Pertinent negatives include no abdominal pain, no back pain, no cough and no headaches.  Pertinent negatives for past medical history include no seizures.    Past Medical History:  Diagnosis Date  . Alcohol abuse   . Anxiety   . Essential hypertension, benign   . Hyperlipidemia   . TBI (traumatic brain injury) [facility](HCC) age 52    Patient Active Problem List   Diagnosis Date Noted  . Chest pain 06/23/2017  . Syncope and collapse 06/23/2017  . Cardiac murmur 07/10/2013  . Sinus tachycardia 07/10/2013  . Screening for prostate cancer 05/02/2012  . Atypical chest pain 03/12/2012  . Left foot pain 03/12/2012  . Onychomycosis 01/01/2012  . Low back pain 10/22/2011  . Traumatic partial tear of right biceps tendon 04/26/2011  . Alcohol abuse 12/14/2010  . 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 Hyperlipidemia 06/25/2009  . OTHER NONSPECIFIC ABNORMAL SERUM ENZYME LEVELS 06/25/2009  . 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 HYPERTENSION, BENIGN SYSTEMIC 05/11/2006  . BPH 05/11/2006    Past Surgical History:  Procedure Laterality Date    . FACIAL RECONSTRUCTION SURGERY          Home Medications    Prior to Admission medications   Medication Sig Start Date End Date Taking? Authorizing Provider  lisinopril-hydrochlorothiazide (PRINZIDE) 20-12.5 MG tablet Take 1 tablet by mouth daily. 06/24/17 10/22/17 Yes Shah, Pratik D, DO  metoprolol tartrate (LOPRESSOR) 25 MG tablet Take 1 tablet (25 mg total) by mouth 2 (two) times daily. 06/24/17 09/22/17 Yes Shah, Pratik D, DO  simvastatin (ZOCOR) 20 MG tablet Take 1 tablet (20 mg total) by mouth at bedtime. 06/24/17 10/22/17 Yes Sherryll BurgerShah, Pratik D, DO  predniSONE (DELTASONE) 20 MG tablet 2 tabs po daily x 3 days 09/06/17   Bethann BerkshireZammit, Jermisha Hoffart, MD    Family History Family History  Problem Relation Age of Onset  . Hypertension Mother   . Alcohol abuse Father   . Heart disease Father   . Stroke Father   . Cancer Father     Social History Social History   Tobacco Use  . Smoking status: Former Smoker    Years: 10.00    Types: Cigarettes    Last attempt to quit: 03/14/1993    Years since quitting: 24.4  . Smokeless tobacco: Never Used  Substance Use Topics  . Alcohol use: Yes    Comment: occasional - hx alcoholism  . Drug use: No     Allergies   Patient has no known allergies.   Review  of Systems Review of Systems  Constitutional: Negative for appetite change and fatigue.  HENT: Negative for congestion, ear discharge and sinus pressure.   Eyes: Negative for discharge.  Respiratory: Negative for cough.   Cardiovascular: Positive for chest pain.  Gastrointestinal: Negative for abdominal pain and diarrhea.  Genitourinary: Negative for frequency and hematuria.  Musculoskeletal: Negative for back pain.  Skin: Positive for rash.  Neurological: Negative for seizures and headaches.  Psychiatric/Behavioral: Negative for hallucinations.     Physical Exam Updated Vital Signs BP 129/82   Pulse 66   Temp 98.8 F (37.1 C) (Oral)   Resp 16   Ht 5\' 8"  (1.727 m)   Wt 72.1 kg (159  lb)   SpO2 97%   BMI 24.18 kg/m   Physical Exam  Constitutional: He is oriented to person, place, and time. He appears well-developed.  HENT:  Head: Normocephalic.  Eyes: Conjunctivae and EOM are normal. No scleral icterus.  Neck: Neck supple. No thyromegaly present.  Cardiovascular: Normal rate and regular rhythm. Exam reveals no gallop and no friction rub.  No murmur heard. Pulmonary/Chest: No stridor. He has no wheezes. He has no rales. He exhibits tenderness.  Abdominal: He exhibits no distension. There is no tenderness. There is no rebound.  Musculoskeletal: Normal range of motion. He exhibits no edema.  Lymphadenopathy:    He has no cervical adenopathy.  Neurological: He is oriented to person, place, and time. He exhibits normal muscle tone. Coordination normal.  Skin: No rash noted. There is erythema.  Psychiatric: He has a normal mood and affect. His behavior is normal.     ED Treatments / Results  Labs (all labs ordered are listed, but only abnormal results are displayed) Labs Reviewed  COMPREHENSIVE METABOLIC PANEL - Abnormal; Notable for the following components:      Result Value   Total Bilirubin 0.2 (*)    All other components within normal limits  CBC WITH DIFFERENTIAL/PLATELET  TROPONIN I    EKG EKG Interpretation  Date/Time:  Wednesday September 06 2017 14:23:17 EDT Ventricular Rate:  104 PR Interval:    QRS Duration: 92 QT Interval:  350 QTC Calculation: 461 R Axis:   73 Text Interpretation:  Sinus tachycardia Baseline wander in lead(s) III Confirmed by Bethann Berkshire [phone]) on 09/06/2017 3:17:50 PM   Radiology Dg Chest 2 View  Result Date: 09/06/2017 CLINICAL DATA:  Mid CP on and off since yesterday. Red raised itchy rash throughout body since yesterday. Denies sob or cough. History of HTN, TBI. EXAM: CHEST - 2 VIEW COMPARISON:  06/23/2016 FINDINGS: Cardiomediastinal silhouette is normal. The lungs are free of focal consolidations and pleural  effusions. No pulmonary edema. Remote LEFT clavicle fracture. IMPRESSION: No evidence for acute cardiopulmonary abnormality. Electronically Signed   By: Norva Pavlov M.D.   On: 09/06/2017 17:13    Procedures Procedures (including critical care time)  Medications Ordered in ED Medications  sodium chloride 0.9 % bolus 500 mL (0 mLs Intravenous Stopped 09/06/17 1635)  diphenhydrAMINE (BENADRYL) injection 25 mg (25 mg Intravenous Given 09/06/17 1534)  methylPREDNISolone sodium succinate (SOLU-MEDROL) 125 mg/2 mL injection 125 mg (125 mg Intravenous Given 09/06/17 1534)     Initial Impression / Assessment and Plan / ED Course  I have reviewed the triage vital signs and the nursing notes.  Pertinent labs & imaging results that were available during my care of the patient were reviewed by me and considered in my medical decision making (see chart for details).   Labs  unremarkable.  Patient improved some with steroids and Benadryl.  Chest pain noncardiac.  Patient will be treated for allergic reaction with prednisone and Benadryl and will follow-up with her primary care doctor if needed    Final Clinical Impressions(s) / ED Diagnoses   Final diagnoses:  Chest wall pain  Allergic reaction, initial encounter    ED Discharge Orders        Ordered    predniSONE (DELTASONE) 20 MG tablet     09/06/17 1832       Bethann Berkshire, MD 09/06/17 1837

## 2017-10-06 ENCOUNTER — Emergency Department (HOSPITAL_COMMUNITY)
Admission: EM | Admit: 2017-10-06 | Discharge: 2017-10-06 | Disposition: A | Discharge status: Home / Self Care | Payer: Self-pay | Attending: Emergency Medicine | Admitting: Emergency Medicine

## 2017-10-06 ENCOUNTER — Other Ambulatory Visit: Payer: Self-pay

## 2017-10-06 ENCOUNTER — Emergency Department (HOSPITAL_COMMUNITY): Payer: Self-pay

## 2017-10-06 ENCOUNTER — Encounter (HOSPITAL_COMMUNITY): Payer: Self-pay | Admitting: Emergency Medicine

## 2017-10-06 DIAGNOSIS — Y999 Unspecified external cause status: Secondary | ICD-10-CM | POA: Insufficient documentation

## 2017-10-06 DIAGNOSIS — Z87891 Personal history of nicotine dependence: Secondary | ICD-10-CM | POA: Insufficient documentation

## 2017-10-06 DIAGNOSIS — Y929 Unspecified place or not applicable: Secondary | ICD-10-CM | POA: Insufficient documentation

## 2017-10-06 DIAGNOSIS — W208XXA Other cause of strike by thrown, projected or falling object, initial encounter: Secondary | ICD-10-CM | POA: Insufficient documentation

## 2017-10-06 DIAGNOSIS — M79673 Pain in unspecified foot: Secondary | ICD-10-CM

## 2017-10-06 DIAGNOSIS — M25579 Pain in unspecified ankle and joints of unspecified foot: Secondary | ICD-10-CM

## 2017-10-06 DIAGNOSIS — S9032XA Contusion of left foot, initial encounter: Secondary | ICD-10-CM | POA: Insufficient documentation

## 2017-10-06 DIAGNOSIS — Y939 Activity, unspecified: Secondary | ICD-10-CM | POA: Insufficient documentation

## 2017-10-06 DIAGNOSIS — Z79899 Other long term (current) drug therapy: Secondary | ICD-10-CM | POA: Insufficient documentation

## 2017-10-06 DIAGNOSIS — I10 Essential (primary) hypertension: Secondary | ICD-10-CM | POA: Insufficient documentation

## 2017-10-06 MED ORDER — ACETAMINOPHEN 325 MG PO TABS
650.0000 mg | ORAL_TABLET | Freq: Once | ORAL | Status: AC
  Administered 2017-10-06: 650 mg via ORAL
  Filled 2017-10-06: qty 2

## 2017-10-06 NOTE — ED Provider Notes (Signed)
[facility] EMERGENCY DEPARTMENT Provider Note   CSN: [id number] Arrival date & time: 10/06/17  0547     History   Chief Complaint Chief Complaint  Patient presents with  . Foot Pain    HPI George Schroeder is a 52 y.o. male.  HPI  Pt was seen at 0720. Per pt, c/o sudden onset and persistence of constant acute flair of his chronic left dorsal foot "pain" since yesterday. Pt states he "dropped something on it" yesterday to me, but to the Triage RN he stated he fell. Pt has been ambulatory since the injury. Denies any other injuries. Denies open wounds, no focal motor weakness, no tingling/numbness in extremities, no rash. The symptoms have been associated with no other complaints. The patient has a significant history of similar symptoms previously, recently being evaluated for this complaint and multiple prior evals for same.      Past Medical History:  Diagnosis Date  . Alcohol abuse   . Anxiety   . Essential hypertension, benign   . Hyperlipidemia   . TBI (traumatic brain injury) [facility]) age 58    Patient Active Problem List   Diagnosis Date Noted  . Chest pain 06/23/2017  . Syncope and collapse 06/23/2017  . Cardiac murmur 07/10/2013  . Sinus tachycardia 07/10/2013  . Screening for prostate cancer 05/02/2012  . Atypical chest pain 03/12/2012  . Left foot pain 03/12/2012  . Onychomycosis 01/01/2012  . Low back pain 10/22/2011  . Traumatic partial tear of right biceps tendon 04/26/2011  . Alcohol abuse 12/14/2010  . 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 Hyperlipidemia 06/25/2009  . OTHER NONSPECIFIC ABNORMAL SERUM ENZYME LEVELS 06/25/2009  . 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 HYPERTENSION, BENIGN SYSTEMIC 05/11/2006  . BPH 05/11/2006    Past Surgical History:  Procedure Laterality Date  . FACIAL RECONSTRUCTION SURGERY          Home Medications    Prior to Admission medications   Medication Sig Start Date End Date Taking? Authorizing  Provider  lisinopril-hydrochlorothiazide (PRINZIDE) 20-12.5 MG tablet Take 1 tablet by mouth daily. 06/24/17 10/22/17  Sherryll Burger, Pratik D, DO  metoprolol tartrate (LOPRESSOR) 25 MG tablet Take 1 tablet (25 mg total) by mouth 2 (two) times daily. 06/24/17 09/22/17  Sherryll Burger, Pratik D, DO  predniSONE (DELTASONE) 20 MG tablet 2 tabs po daily x 3 days 09/06/17   Bethann Berkshire, MD  simvastatin (ZOCOR) 20 MG tablet Take 1 tablet (20 mg total) by mouth at bedtime. 06/24/17 10/22/17  Maurilio Lovely D, DO    Family History Family History  Problem Relation Age of Onset  . Hypertension Mother   . Alcohol abuse Father   . Heart disease Father   . Stroke Father   . Cancer Father     Social History Social History   Tobacco Use  . Smoking status: Former Smoker    Years: 10.00    Types: Cigarettes    Last attempt to quit: 03/14/1993    Years since quitting: 24.5  . Smokeless tobacco: Never Used  Substance Use Topics  . Alcohol use: Yes    Comment: occasional - hx alcoholism  . Drug use: No     Allergies   Patient has no known allergies.   Review of Systems Review of Systems ROS: Statement: All systems negative except as marked or noted in the HPI; Constitutional: Negative for fever and chills. ; ; Eyes: Negative for eye pain, redness and discharge. ; ; ENMT: Negative for ear  pain, hoarseness, nasal congestion, sinus pressure and sore throat. ; ; Cardiovascular: Negative for chest pain, palpitations, diaphoresis, dyspnea and peripheral edema. ; ; Respiratory: Negative for cough, wheezing and stridor. ; ; Gastrointestinal: Negative for nausea, vomiting, diarrhea, abdominal pain, blood in stool, hematemesis, jaundice and rectal bleeding. . ; ; Genitourinary: Negative for dysuria, flank pain and hematuria. ; ; Musculoskeletal: +left foot pain. Negative for back pain and neck pain. Negative for deformity.; ; Skin: Negative for pruritus, rash, abrasions, blisters, bruising and skin lesion.; ; Neuro: Negative for  headache, lightheadedness and neck stiffness. Negative for weakness, altered level of consciousness, altered mental status, extremity weakness, paresthesias, involuntary movement, seizure and syncope.       Physical Exam Updated Vital Signs BP 122/76 (BP Location: Left Arm)   Pulse 76   Temp 97.7 F (36.5 C) (Oral)   Resp 18   Ht 5\' 8"  (1.727 m)   Wt 73 kg (161 lb)   SpO2 98%   BMI 24.48 kg/m   Physical Exam 0725: Physical examination:  Nursing notes reviewed; Vital signs and O2 SAT reviewed;  Constitutional: Well developed, Well nourished, Well hydrated, In no acute distress; Head:  Normocephalic, atraumatic; Eyes: EOMI, PERRL, No scleral icterus; ENMT: Mouth and pharynx normal, Mucous membranes moist; Neck: Supple, Full range of motion; Cardiovascular: Regular rate and rhythm; Respiratory: Breath sounds clear & equal bilaterally. Speaking full sentences with ease, Normal respiratory effort/excursion; Chest: Nontender, Movement normal; Abdomen: Soft, Nondistended; Extremities: Peripheral pulses normal, NT to palp left ankle/knee. No edema, NMS intact left foot, strong pedal pp, LE muscle compartments soft.  No left proximal fibular head tenderness. +mild dorsal left foot tenderness to palp with very mild edema.  No deformity, no rash, no ecchymosis, no open wounds.  +plantarflexion of left foot w/calf squeeze.  No palpable gap left Achilles's tendon.  No calf tenderness, edema or asymmetry.; Neuro: AA&Ox3, Major CN grossly intact.  Speech clear. No gross focal motor or sensory deficits in extremities. Climbs on and off stretcher easily by himself. Gait steady..; Skin: Color normal, Warm, Dry.   ED Treatments / Results  Labs (all labs ordered are listed, but only abnormal results are displayed)   EKG None  Radiology   Procedures Procedures (including critical care time)  Medications Ordered in ED Medications - No data to display   Initial Impression / Assessment and Plan / ED  Course  I have reviewed the triage vital signs and the nursing notes.  Pertinent labs & imaging results that were available during my care of the patient were reviewed by me and considered in my medical decision making (see chart for details).  MDM Reviewed: previous chart, nursing note and vitals Interpretation: x-ray    Dg Ankle Complete Left Result Date: 10/06/2017 CLINICAL DATA:  Larey Seat yesterday with twisting injury to the left foot and ankle. Pain and swelling. EXAM: LEFT FOOT - COMPLETE 3+ VIEW; LEFT ANKLE COMPLETE - 3+ VIEW COMPARISON:  None. FINDINGS: Three is of the left foot and three views of the left ankle are obtained. Soft tissue swelling over the dorsum of the left foot. Bones of the left foot and ankle appear intact. No evidence of acute fracture or subluxation. No focal bone lesion or bone destruction. Bone cortex and trabecular architecture appear intact. No radiopaque soft tissue foreign bodies. IMPRESSION: Soft tissue swelling over the dorsum of the left foot. No acute bony abnormalities in the left foot or left ankle. Electronically Signed   By: Burman Nieves  M.D.   On: 10/06/2017 06:17   Dg Foot Complete Left Result Date: 10/06/2017 CLINICAL DATA:  Larey SeatFell yesterday with twisting injury to the left foot and ankle. Pain and swelling. EXAM: LEFT FOOT - COMPLETE 3+ VIEW; LEFT ANKLE COMPLETE - 3+ VIEW COMPARISON:  None. FINDINGS: Three is of the left foot and three views of the left ankle are obtained. Soft tissue swelling over the dorsum of the left foot. Bones of the left foot and ankle appear intact. No evidence of acute fracture or subluxation. No focal bone lesion or bone destruction. Bone cortex and trabecular architecture appear intact. No radiopaque soft tissue foreign bodies. IMPRESSION: Soft tissue swelling over the dorsum of the left foot. No acute bony abnormalities in the left foot or left ankle. Electronically Signed   By: Burman NievesWilliam  Stevens M.D.   On: 10/06/2017 06:17     0735:  XR reassuring. Tx symptomatically at this time. Pt has come to the ED today with several others (pt's and non-pt's) that are all frequent ED visitors for chronic pain; concerned regarding this and will not rx narcotics. Dx and testing d/w pt and friend.  Questions answered.  Verb understanding, agreeable to d/c home with outpt f/u.     Final Clinical Impressions(s) / ED Diagnoses   Final diagnoses:  Contusion of left foot, initial encounter    ED Discharge Orders    None       Samuel JesterMcManus, Alichia Alridge, DO 10/08/17 [id number]

## 2017-10-06 NOTE — ED Triage Notes (Signed)
Pt c/o foot pain since falling at work yesterday.

## 2017-10-06 NOTE — Discharge Instructions (Addendum)
Take over the counter tylenol and ibuprofen, as directed on packaging, as needed for discomfort. Apply moist heat or ice to the area(s) of discomfort, for 15 minutes at a time, several times per day for the next few days.  Do not fall asleep on a heating or ice pack.  Call your regular medical doctor or the Orthopedist today to schedule a follow up appointment within the next week.  Return to the Emergency Department immediately if worsening.

## 2018-02-17 ENCOUNTER — Emergency Department (HOSPITAL_COMMUNITY): Payer: Self-pay

## 2018-02-17 ENCOUNTER — Other Ambulatory Visit: Payer: Self-pay

## 2018-02-17 ENCOUNTER — Encounter (HOSPITAL_COMMUNITY): Payer: Self-pay

## 2018-02-17 ENCOUNTER — Emergency Department (HOSPITAL_COMMUNITY)
Admission: EM | Admit: 2018-02-17 | Discharge: 2018-02-17 | Disposition: A | Discharge status: Home / Self Care | Payer: Self-pay | Attending: Emergency Medicine | Admitting: Emergency Medicine

## 2018-02-17 DIAGNOSIS — I10 Essential (primary) hypertension: Secondary | ICD-10-CM | POA: Insufficient documentation

## 2018-02-17 DIAGNOSIS — S022XXA Fracture of nasal bones, initial encounter for closed fracture: Secondary | ICD-10-CM | POA: Insufficient documentation

## 2018-02-17 DIAGNOSIS — Y939 Activity, unspecified: Secondary | ICD-10-CM | POA: Insufficient documentation

## 2018-02-17 DIAGNOSIS — Y929 Unspecified place or not applicable: Secondary | ICD-10-CM | POA: Insufficient documentation

## 2018-02-17 DIAGNOSIS — Y999 Unspecified external cause status: Secondary | ICD-10-CM | POA: Insufficient documentation

## 2018-02-17 DIAGNOSIS — W208XXA Other cause of strike by thrown, projected or falling object, initial encounter: Secondary | ICD-10-CM | POA: Insufficient documentation

## 2018-02-17 DIAGNOSIS — S0121XA Laceration without foreign body of nose, initial encounter: Secondary | ICD-10-CM | POA: Insufficient documentation

## 2018-02-17 DIAGNOSIS — Z87891 Personal history of nicotine dependence: Secondary | ICD-10-CM | POA: Insufficient documentation

## 2018-02-17 DIAGNOSIS — Z79899 Other long term (current) drug therapy: Secondary | ICD-10-CM | POA: Insufficient documentation

## 2018-02-17 MED ORDER — HYDROCHLOROTHIAZIDE 25 MG PO TABS: 25.0000 mg | ORAL_TABLET | Freq: Every day | ORAL | 3 refills | Status: DC

## 2018-02-17 NOTE — ED Triage Notes (Signed)
Pt reports a piece of plastic hanging from the ceiling at work fell and hit pt on the bridge of his nose.  Laceration noted.  Pt covered wound with bandaid.  Bleeding controlled.  No loss of consicousness.

## 2018-02-17 NOTE — ED Provider Notes (Signed)
[facility] Provider Note   CSN: [id number] Arrival date & time: 02/17/18  1656     History   Chief Complaint Chief Complaint  Patient presents with  . Facial Laceration    HPI George Schroeder is a 52 y.o. male.  The history is provided by the patient. No language interpreter was used.  Facial Injury  Mechanism of injury:  Direct blow Location:  Nose Pain details:    Quality:  Aching   Severity:  Mild   Timing:  Constant   Progression:  Worsening Foreign body present:  No foreign bodies Relieved by:  Nothing Ineffective treatments:  None tried Associated symptoms: no altered mental status   Pt reports a piece of plastic fell from the ceiling hitting his nose.  Pt complains of pain to his nose and a laceration   Past Medical History:  Diagnosis Date  . Alcohol abuse   . Anxiety   . Essential hypertension, benign   . Hyperlipidemia   . TBI (traumatic brain injury) [facility](HCC) age 52    Patient Active Problem List   Diagnosis Date Noted  . Chest pain 06/23/2017  . Syncope and collapse 06/23/2017  . Cardiac murmur 07/10/2013  . Sinus tachycardia 07/10/2013  . Screening for prostate cancer 05/02/2012  . Atypical chest pain 03/12/2012  . Left foot pain 03/12/2012  . Onychomycosis 01/01/2012  . Low back pain 10/22/2011  . Traumatic partial tear of right biceps tendon 04/26/2011  . Alcohol abuse 12/14/2010  . 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 Hyperlipidemia 06/25/2009  . OTHER NONSPECIFIC ABNORMAL SERUM ENZYME LEVELS 06/25/2009  . 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 HYPERTENSION, BENIGN SYSTEMIC 05/11/2006  . BPH 05/11/2006    Past Surgical History:  Procedure Laterality Date  . FACIAL RECONSTRUCTION SURGERY          Home Medications    Prior to Admission medications   Medication Sig Start Date End Date Taking? Authorizing Provider  lisinopril-hydrochlorothiazide (PRINZIDE) 20-12.5 MG tablet Take 1 tablet by  mouth daily. 06/24/17 10/22/17  Sherryll BurgerShah, Pratik D, DO  metoprolol tartrate (LOPRESSOR) 25 MG tablet Take 1 tablet (25 mg total) by mouth 2 (two) times daily. 06/24/17 09/22/17  Sherryll BurgerShah, Pratik D, DO  predniSONE (DELTASONE) 20 MG tablet 2 tabs po daily x 3 days 09/06/17   Bethann BerkshireZammit, Joseph, MD  simvastatin (ZOCOR) 20 MG tablet Take 1 tablet (20 mg total) by mouth at bedtime. 06/24/17 10/22/17  Maurilio LovelyShah, Pratik D, DO    Family History Family History  Problem Relation Age of Onset  . Hypertension Mother   . Alcohol abuse Father   . Heart disease Father   . Stroke Father   . Cancer Father     Social History Social History   Tobacco Use  . Smoking status: Former Smoker    Years: 10.00    Types: Cigarettes    Last attempt to quit: 03/14/1993    Years since quitting: 24.9  . Smokeless tobacco: Never Used  Substance Use Topics  . Alcohol use: Yes    Comment: occasional - hx alcoholism  . Drug use: No     Allergies   Patient has no known allergies.   Review of Systems Review of Systems  All other systems reviewed and are negative.    Physical Exam Updated Vital Signs BP (!) 141/80 (BP Location: Right Arm)   Pulse 89   Temp 98 F (36.7 C) (Oral)   Resp 20   Ht 5'  8" (1.727 m)   Wt 72.1 kg   SpO2 96%   BMI 24.18 kg/m   Physical Exam  Constitutional: He appears well-developed and well-nourished.  HENT:  Head: Normocephalic and atraumatic.  Swelling mid nose, 8 mm laceration nose, gapping  Eyes: Conjunctivae are normal.  Neck: Neck supple.  Cardiovascular: Normal rate.  No murmur heard. Pulmonary/Chest: Effort normal. No respiratory distress.  Abdominal: Soft. There is no tenderness.  Musculoskeletal: He exhibits no edema.  Neurological: He is alert.  Skin: Skin is warm and dry.  Psychiatric: He has a normal mood and affect.  Nursing note and vitals reviewed.    ED Treatments / Results  Labs (all labs ordered are listed, but only abnormal results are displayed) Labs  Reviewed - No data to display  EKG None  Radiology Dg Nasal Bones  Result Date: 02/17/2018 CLINICAL DATA:  Nose injury. EXAM: NASAL BONES - 3+ VIEW COMPARISON:  None. FINDINGS: Nondisplaced nasal bone fracture noted bilaterally. No evidence for hemorrhage in the maxillary sinuses. Medial and inferior orbital walls appear intact. IMPRESSION: Nondisplaced nasal bone fracture. Electronically Signed   By: Kennith Center M.D.   On: 02/17/2018 18:39    Procedures .Marland KitchenLaceration Repair Date/Time: 02/17/2018 6:52 PM Performed by: Elson Areas, PA-C Authorized by: Elson Areas, PA-C   Consent:    Consent obtained:  Verbal   Consent given by:  Patient   Risks discussed:  Infection, need for additional repair, pain, poor cosmetic result and poor wound healing   Alternatives discussed:  No treatment and delayed treatment Universal protocol:    Procedure explained and questions answered to patient or proxy's satisfaction: yes     Relevant documents present and verified: yes     Test results available and properly labeled: yes     Imaging studies available: yes     Required blood products, implants, devices, and special equipment available: yes     Site/side marked: yes     Immediately prior to procedure, a time out was called: yes     Patient identity confirmed:  Verbally with patient Anesthesia (see MAR for exact dosages):    Anesthesia method:  None Laceration details:    Location:  Face   Face location:  Nose   Length (cm):  0.8   Depth (mm):  3 Repair type:    Repair type:  Simple Pre-procedure details:    Preparation:  Patient was prepped and draped in usual sterile fashion Treatment:    Area cleansed with:  Betadine   Irrigation solution:  Sterile saline Skin repair:    Repair method:  Tissue adhesive Approximation:    Approximation:  Close Post-procedure details:    Patient tolerance of procedure:  Tolerated well, no immediate complications   (including critical care  time)  Medications Ordered in ED Medications - No data to display   Initial Impression / Assessment and Plan / ED Course  I have reviewed the triage vital signs and the nursing notes.  Pertinent labs & imaging results that were available during my care of the patient were reviewed by me and considered in my medical decision making (see chart for details).       Final Clinical Impressions(s) / ED Diagnoses   Final diagnoses:  Closed fracture of nasal bone, initial encounter  Laceration of nose, initial encounter    ED Discharge Orders    None    An After Visit Summary was printed and given to the patient.  Elson Areas, [place] 02/17/18 1854    Vanetta Mulders, MD 02/17/18 [phone]

## 2018-11-20 ENCOUNTER — Emergency Department (HOSPITAL_COMMUNITY)
Admission: EM | Admit: 2018-11-20 | Discharge: 2018-11-21 | Disposition: A | Discharge status: Home / Self Care | Payer: BC Managed Care – PPO | Attending: Emergency Medicine | Admitting: Emergency Medicine

## 2018-11-20 ENCOUNTER — Other Ambulatory Visit: Payer: Self-pay

## 2018-11-20 ENCOUNTER — Encounter (HOSPITAL_COMMUNITY): Payer: Self-pay | Admitting: Emergency Medicine

## 2018-11-20 DIAGNOSIS — I10 Essential (primary) hypertension: Secondary | ICD-10-CM | POA: Insufficient documentation

## 2018-11-20 DIAGNOSIS — Z79899 Other long term (current) drug therapy: Secondary | ICD-10-CM | POA: Insufficient documentation

## 2018-11-20 DIAGNOSIS — I160 Hypertensive urgency: Secondary | ICD-10-CM | POA: Diagnosis not present

## 2018-11-20 DIAGNOSIS — R609 Edema, unspecified: Secondary | ICD-10-CM

## 2018-11-20 DIAGNOSIS — Z87891 Personal history of nicotine dependence: Secondary | ICD-10-CM | POA: Insufficient documentation

## 2018-11-20 DIAGNOSIS — R600 Localized edema: Secondary | ICD-10-CM | POA: Diagnosis not present

## 2018-11-20 DIAGNOSIS — R0602 Shortness of breath: Secondary | ICD-10-CM | POA: Insufficient documentation

## 2018-11-20 LAB — CBC WITH DIFFERENTIAL/PLATELET
Abs Immature Granulocytes: 0.02 10*3/uL (ref 0.00–0.07)
Basophils Absolute: 0 10*3/uL (ref 0.0–0.1)
Basophils Relative: 0 %
Eosinophils Absolute: 0.2 10*3/uL (ref 0.0–0.5)
Eosinophils Relative: 2 %
HCT: 43.5 % (ref 39.0–52.0)
Hemoglobin: 13.8 g/dL (ref 13.0–17.0)
Immature Granulocytes: 0 %
Lymphocytes Relative: 24 %
Lymphs Abs: 1.6 10*3/uL (ref 0.7–4.0)
MCH: 31.3 pg (ref 26.0–34.0)
MCHC: 31.7 g/dL (ref 30.0–36.0)
MCV: 98.6 fL (ref 80.0–100.0)
Monocytes Absolute: 0.6 10*3/uL (ref 0.1–1.0)
Monocytes Relative: 9 %
Neutro Abs: 4.2 10*3/uL (ref 1.7–7.7)
Neutrophils Relative %: 65 %
Platelets: 248 10*3/uL (ref 150–400)
RBC: 4.41 MIL/uL (ref 4.22–5.81)
RDW: 12.7 % (ref 11.5–15.5)
WBC: 6.6 10*3/uL (ref 4.0–10.5)
nRBC: 0 % (ref 0.0–0.2)

## 2018-11-20 LAB — BASIC METABOLIC PANEL
Anion gap: 11 (ref 5–15)
BUN: 15 mg/dL (ref 6–20)
CO2: 29 mmol/L (ref 22–32)
Calcium: 9.1 mg/dL (ref 8.9–10.3)
Chloride: 98 mmol/L (ref 98–111)
Creatinine, Ser: 0.97 mg/dL (ref 0.61–1.24)
GFR calc Af Amer: >60 mL/min (ref >60)
GFR calc non Af Amer: >60 mL/min (ref >60)
Glucose, Bld: 98 mg/dL (ref 70–99)
Potassium: 4.2 mmol/L (ref 3.5–5.1)
Sodium: 138 mmol/L (ref 135–145)

## 2018-11-20 LAB — BRAIN NATRIURETIC PEPTIDE: B Natriuretic Peptide: 32 pg/mL (ref 0.0–100.0)

## 2018-11-20 NOTE — ED Triage Notes (Addendum)
Patient complaining of bilateral feet swelling x 4 days. Slight swelling to top of left foot noted at triage. No swelling noted to right foot. Patient's B/P elevated at triage. States he has been out of his blood pressure medication x 2 months.

## 2018-11-21 ENCOUNTER — Emergency Department (HOSPITAL_COMMUNITY): Payer: BC Managed Care – PPO

## 2018-11-21 LAB — TROPONIN I (HIGH SENSITIVITY)
Troponin I (High Sensitivity): 6 ng/L (ref <18)
Troponin I (High Sensitivity): 6 ng/L (ref <18)

## 2018-11-21 LAB — D-DIMER, QUANTITATIVE: D-Dimer, Quant: 0.8 ug/mL-FEU — ABNORMAL HIGH (ref 0.00–0.50)

## 2018-11-21 MED ORDER — ENOXAPARIN SODIUM 80 MG/0.8ML ~~LOC~~ SOLN
1.0000 mg/kg | Freq: Once | SUBCUTANEOUS | Status: AC
  Administered 2018-11-21: 75 mg via SUBCUTANEOUS
  Filled 2018-11-21: qty 0.8

## 2018-11-21 MED ORDER — IOHEXOL 350 MG/ML SOLN
100.0000 mL | Freq: Once | INTRAVENOUS | Status: AC | PRN
  Administered 2018-11-21: 03:00:00 100 mL via INTRAVENOUS

## 2018-11-21 MED ORDER — METOPROLOL TARTRATE 25 MG PO TABS: 25.0000 mg | ORAL_TABLET | Freq: Two times a day (BID) | ORAL | 0 refills | Status: DC

## 2018-11-21 MED ORDER — LISINOPRIL 10 MG PO TABS
20.0000 mg | ORAL_TABLET | Freq: Once | ORAL | Status: AC
  Administered 2018-11-21: 01:00:00 20 mg via ORAL
  Filled 2018-11-21: qty 2

## 2018-11-21 MED ORDER — LISINOPRIL-HYDROCHLOROTHIAZIDE 20-12.5 MG PO TABS: 1.0000 | ORAL_TABLET | Freq: Every day | ORAL | 0 refills | Status: DC

## 2018-11-21 MED ORDER — HYDROCHLOROTHIAZIDE 25 MG PO TABS
25.0000 mg | ORAL_TABLET | Freq: Every day | ORAL | Status: DC
  Administered 2018-11-21: 25 mg via ORAL
  Filled 2018-11-21: qty 1

## 2018-11-21 MED ORDER — METOPROLOL TARTRATE 25 MG PO TABS
25.0000 mg | ORAL_TABLET | Freq: Once | ORAL | Status: AC
  Administered 2018-11-21: 01:00:00 25 mg via ORAL
  Filled 2018-11-21: qty 1

## 2018-11-21 NOTE — Discharge Instructions (Signed)
Take your blood pressure medications as prescribed.  This should help with the swelling in your legs.  Return tomorrow for ultrasound of your legs to rule out blood clot.  Return to the ED develop chest pain, shortness of breath, any other concerns.

## 2018-11-21 NOTE — Congregational Nurse Program (Signed)
  Dept: [phone]   Congregational Nurse Program Note  Date of Encounter: 11/21/2018  Past Medical History: Past Medical History:  Diagnosis Date  . Alcohol abuse   . Anxiety   . Essential hypertension, benign   . Hyperlipidemia   . TBI (traumatic brain injury) [facility]) age 53    Encounter Details: CNP Questionnaire - 11/21/18 1000      Questionnaire   Patient Status  Not Applicable    Race  Black or African American    Location Patient Served At  [employer]  Not Applicable   Just insurance where he works   [employer]  Yes, have food insecurities    Housing/Utilities  Yes, have permanent housing    Transportation  No transportation needs   Family and friends help with this right now   Chiropractor  Yes, feel physically and emotionally safe where you currently live    Medication  No medication insecurities    Medical Provider  Yes   going to [facility] right now .   Referrals  Not Applicable    ED Visit Averted  Not Applicable    Life-Saving Intervention Made  Not Applicable     0/04/5425  Requesting Blood Pressure check B/P 121/74 Pulse 77 . Takes Blood Pressure medication. Goes to [employer] for now, just got insurance at job. Presently on B/P medication.  Marko Plume [phone]

## 2018-11-21 NOTE — ED Notes (Signed)
Patient transported to CT 

## 2018-11-21 NOTE — ED Notes (Signed)
Patient transported to X-ray 

## 2018-11-21 NOTE — ED Provider Notes (Signed)
[facility] EMERGENCY DEPARTMENT Provider Note   CSN: [id number] Arrival date & time: 11/20/18  1824     History   Chief Complaint Chief Complaint  Patient presents with  . Leg Swelling    HPI George Schroeder is a 53 y.o. male.     Patient with history of hypertension, alcohol abuse, TBI presenting with bilateral feet pain and leg swelling for the past 3 to 4 days.  Denies any fall or trauma.  States the left foot is more swollen than the right.  He has difficulty walking because of the swelling.  States he has not had his blood pressure medication for for 5 months.  Denies any history of CHF, DVT or PE.  States he always has some trouble breathing due to lung injury in the past and this is unchanged.  No cough or fever.  No chest pain.  No abdominal pain, nausea or vomiting.  States he recently got insurance and is going to establish care with a new doctor.  The history is provided by the patient.    Past Medical History:  Diagnosis Date  . Alcohol abuse   . Anxiety   . Essential hypertension, benign   . Hyperlipidemia   . TBI (traumatic brain injury) [facility]) age [age]    Patient Active Problem List   Diagnosis Date Noted  . Chest pain 06/23/2017  . Syncope and collapse 06/23/2017  . Cardiac murmur 07/10/2013  . Sinus tachycardia 07/10/2013  . Screening for prostate cancer 05/02/2012  . Atypical chest pain 03/12/2012  . Left foot pain 03/12/2012  . Onychomycosis 01/01/2012  . Low back pain 10/22/2011  . Traumatic partial tear of right biceps tendon 04/26/2011  . Alcohol abuse 12/14/2010  . 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 Hyperlipidemia 06/25/2009  . OTHER NONSPECIFIC ABNORMAL SERUM ENZYME LEVELS 06/25/2009  . 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 HYPERTENSION, BENIGN SYSTEMIC 05/11/2006  . BPH 05/11/2006    Past Surgical History:  Procedure Laterality Date  . FACIAL RECONSTRUCTION SURGERY          Home Medications    Prior to Admission  medications   Medication Sig Start Date End Date Taking? Authorizing Provider  hydrochlorothiazide (HYDRODIURIL) 25 MG tablet Take 1 tablet (25 mg total) by mouth daily. 02/17/18   Elson AreasSofia, Leslie K, PA-C  lisinopril-hydrochlorothiazide (PRINZIDE) 20-12.5 MG tablet Take 1 tablet by mouth daily. 06/24/17 10/22/17  Sherryll BurgerShah, Pratik D, DO  metoprolol tartrate (LOPRESSOR) 25 MG tablet Take 1 tablet (25 mg total) by mouth 2 (two) times daily. 06/24/17 09/22/17  Sherryll BurgerShah, Pratik D, DO  predniSONE (DELTASONE) 20 MG tablet 2 tabs po daily x 3 days 09/06/17   Bethann BerkshireZammit, Joseph, MD  simvastatin (ZOCOR) 20 MG tablet Take 1 tablet (20 mg total) by mouth at bedtime. 06/24/17 10/22/17  Maurilio LovelyShah, Pratik D, DO    Family History Family History  Problem Relation Age of Onset  . Hypertension Mother   . Alcohol abuse Father   . Heart disease Father   . Stroke Father   . Cancer Father     Social History Social History   Tobacco Use  . Smoking status: Former Smoker    Years: 10.00    Types: Cigarettes    Quit date: 03/14/1993    Years since quitting: 25.7  . Smokeless tobacco: Never Used  Substance Use Topics  . Alcohol use: Yes    Comment: occasional - hx alcoholism  . Drug use: No  Allergies   Patient has no known allergies.   Review of Systems Review of Systems  Constitutional: Negative for activity change, appetite change, fatigue and fever.  HENT: Negative for congestion and rhinorrhea.   Respiratory: Positive for chest tightness and shortness of breath. Negative for cough.   Cardiovascular: Positive for leg swelling. Negative for chest pain.  Gastrointestinal: Negative for abdominal pain, nausea and vomiting.  Genitourinary: Negative for dysuria and hematuria.  Musculoskeletal: Negative for arthralgias and myalgias.  Skin: Negative for wound.  Neurological: Negative for dizziness, weakness and headaches.    all other systems are negative except as noted in the HPI and PMH.   Physical Exam Updated  Vital Signs BP (!) 176/103   Pulse 89   Temp 98 F (36.7 C) (Oral)   Resp 20   Ht 5\' 8"  (1.727 m)   Wt 73.5 kg   SpO2 100%   BMI 24.63 kg/m   Physical Exam Vitals signs and nursing note reviewed.  Constitutional:      General: He is not in acute distress.    Appearance: He is well-developed.  HENT:     Head: Normocephalic and atraumatic.     Mouth/Throat:     Pharynx: No oropharyngeal exudate.  Eyes:     Conjunctiva/sclera: Conjunctivae normal.     Pupils: Pupils are equal, round, and reactive to light.  Neck:     Musculoskeletal: Normal range of motion and neck supple.     Comments: No meningismus. Cardiovascular:     Rate and Rhythm: Normal rate and regular rhythm.     Heart sounds: Normal heart sounds. No murmur.  Pulmonary:     Effort: Pulmonary effort is normal. No respiratory distress.     Breath sounds: Normal breath sounds.  Abdominal:     Palpations: Abdomen is soft.     Tenderness: There is no abdominal tenderness. There is no guarding or rebound.  Musculoskeletal: Normal range of motion.        General: No tenderness.     Right lower leg: Edema present.     Left lower leg: Edema present.     Comments: Trace pedal edema left greater than right.  There is hyperkeratotic rash on the dorsum of the left foot without erythema. Intact DP and PT pulses bilaterally. No calf asymmetry or tenderness. Full range of motion bilateral hips and ankles and knees  Skin:    General: Skin is warm.     Capillary Refill: Capillary refill takes less than 2 seconds.  Neurological:     General: No focal deficit present.     Mental Status: He is alert and oriented to person, place, and time. Mental status is at baseline.     Cranial Nerves: No cranial nerve deficit.     Motor: No abnormal muscle tone.     Coordination: Coordination normal.     Comments:  5/5 strength throughout. CN 2-12 intact.Equal grip strength.   Psychiatric:        Behavior: Behavior normal.      ED  Treatments / Results  Labs (all labs ordered are listed, but only abnormal results are displayed) Labs Reviewed  D-DIMER, QUANTITATIVE (NOT AT [facility]) - Abnormal; Notable for the following components:      Result Value   D-Dimer, Quant 0.80 (*)    All other components within normal limits  CBC WITH DIFFERENTIAL/PLATELET  BASIC METABOLIC PANEL  BRAIN NATRIURETIC PEPTIDE  URINALYSIS, ROUTINE W REFLEX MICROSCOPIC  TROPONIN I (HIGH SENSITIVITY)  TROPONIN I (HIGH SENSITIVITY)    EKG EKG Interpretation  Date/Time:  Wednesday November 21 2018 00:57:19 EDT Ventricular Rate:  71 PR Interval:    QRS Duration: 98 QT Interval:  404 QTC Calculation: 439 R Axis:   62 Text Interpretation:  Sinus rhythm No significant change was found Confirmed by [name] [phone]) on 11/21/2018 1:19:22 AM   Radiology Dg Chest 2 View  Result Date: 11/21/2018 CLINICAL DATA:  Shortness of breath EXAM: CHEST - 2 VIEW COMPARISON:  09/06/2017 FINDINGS: Mild hyperexpansion of the lungs. Heart and mediastinal contours are within normal limits. No focal opacities or effusions. No acute bony abnormality. IMPRESSION: No active cardiopulmonary disease. Electronically Signed   By: Charlett Nose M.D.   On: 11/21/2018 01:37   Ct Angio Chest Pe W And/or Wo Contrast  Result Date: 11/21/2018 CLINICAL DATA:  Bilateral feet swelling.  Positive D-dimer. EXAM: CT ANGIOGRAPHY CHEST WITH CONTRAST TECHNIQUE: Multidetector CT imaging of the chest was performed using the standard protocol during bolus administration of intravenous contrast. Multiplanar CT image reconstructions and MIPs were obtained to evaluate the vascular anatomy. CONTRAST:  OMNIPAQUE IOHEXOL 350 MG/ML SOLN COMPARISON:  06/15/2016 FINDINGS: Cardiovascular: No filling defects in the pulmonary arteries to suggestpulmonary emboli. Heart is normal size. Aorta is normal caliber. Mediastinum/Nodes: No mediastinal, hilar, or axillary adenopathy. Small bilateral axillary  lymph nodes, likely reactive. Trachea and esophagus are unremarkable. Thyroid unremarkable. Lungs/Pleura: Tiny right apical nodule, 2-3 mm on image 27 is stable since 2018 study. No confluent opacities or effusions. Upper Abdomen: Imaging into the upper abdomen shows no acute findings. Musculoskeletal: Chest wall soft tissues are unremarkable. No acute bony abnormality. Review of the MIP images confirms the above findings. IMPRESSION: No evidence of pulmonary embolus. No acute cardiopulmonary disease. Electronically Signed   By: Charlett Nose M.D.   On: 11/21/2018 03:17    Procedures Procedures (including critical care time)  Medications Ordered in ED Medications - No data to display   Initial Impression / Assessment and Plan / ED Course  I have reviewed the triage vital signs and the nursing notes.  Pertinent labs & imaging results that were available during my care of the patient were reviewed by me and considered in my medical decision making (see chart for details).       Feet swelling for several days.  No chest pain or shortness of breath.  Elevated blood pressure in setting of noncompliance.  Labs reassuring with normal creatinine and BNP.  Low suspicion for CHF. Echocardiogram in 2019 showed normal EF.  Labs reassuring with no evidence of endorgan damage.  Troponin negative and EKG unchanged.  Chest x-ray negative without evidence of CHF.  D-dimer slightly elevated 0.8.  Troponin negative x2.  No chest pain for several days.  CT negative for pulmonary embolism.  We will arrange for patient to return tomorrow for Doppler ultrasound to rule out DVT. Lovenox dose given tonight  Patient given his blood pressure medications and compliance stressed.  He is to return tomorrow for ultrasound. Establish care with a PCP.  Return to the ED with chest pain, shortness of breath, any other concerns  BP (!) 163/98   Pulse 76   Temp 98 F (36.7 C) (Oral)   Resp 14   Ht 5\' 8"  (1.727 m)    Wt 73.5 kg   SpO2 100%   BMI 24.63 kg/m   Final Clinical Impressions(s) / ED Diagnoses   Final diagnoses:  Peripheral edema    ED  Discharge Orders    None       Glynn Octaveancour, Jonea Bukowski, MD 11/21/18 [phone]

## 2018-11-26 ENCOUNTER — Ambulatory Visit (HOSPITAL_COMMUNITY)
Admission: RE | Admit: 2018-11-26 | Discharge: 2018-11-26 | Disposition: A | Discharge status: Home / Self Care | Payer: Self-pay | Source: Ambulatory Visit | Attending: Emergency Medicine | Admitting: Emergency Medicine

## 2018-11-26 ENCOUNTER — Other Ambulatory Visit: Payer: Self-pay

## 2018-11-26 DIAGNOSIS — R600 Localized edema: Secondary | ICD-10-CM | POA: Insufficient documentation

## 2018-11-26 NOTE — ED Provider Notes (Signed)
6:09 PM  Patient had venous ultrasound of both lower extremities because of bilateral leg edema.  The ultrasound is negative for deep vein thrombus bilaterally.  I have given the patient the information and results from this testing.  The patient is ambulatory without problem.  He is in no distress.  Questions were answered.  I have asked the patient to continue to establish care with a primary physician.  Of asked him to return to the emergency department if any worsening of his symptoms, changes in his condition, problems or concerns.  Patient is in agreement with this plan.   Lily Kocher, PA-C 11/26/18 1814    Fredia Sorrow, MD 12/03/18 [phone]

## 2019-01-21 ENCOUNTER — Emergency Department (HOSPITAL_COMMUNITY)
Admission: EM | Admit: 2019-01-21 | Discharge: 2019-01-21 | Disposition: A | Discharge status: Home / Self Care | Payer: BC Managed Care – PPO | Attending: Emergency Medicine | Admitting: Emergency Medicine

## 2019-01-21 ENCOUNTER — Emergency Department (HOSPITAL_COMMUNITY): Payer: BC Managed Care – PPO

## 2019-01-21 ENCOUNTER — Encounter (HOSPITAL_COMMUNITY): Payer: Self-pay | Admitting: Emergency Medicine

## 2019-01-21 ENCOUNTER — Other Ambulatory Visit: Payer: Self-pay

## 2019-01-21 DIAGNOSIS — Z20828 Contact with and (suspected) exposure to other viral communicable diseases: Secondary | ICD-10-CM | POA: Diagnosis not present

## 2019-01-21 DIAGNOSIS — Z79899 Other long term (current) drug therapy: Secondary | ICD-10-CM | POA: Diagnosis not present

## 2019-01-21 DIAGNOSIS — Z87891 Personal history of nicotine dependence: Secondary | ICD-10-CM | POA: Insufficient documentation

## 2019-01-21 DIAGNOSIS — I10 Essential (primary) hypertension: Secondary | ICD-10-CM | POA: Insufficient documentation

## 2019-01-21 DIAGNOSIS — J069 Acute upper respiratory infection, unspecified: Secondary | ICD-10-CM | POA: Insufficient documentation

## 2019-01-21 MED ORDER — BENZONATATE 100 MG PO CAPS: 100.0000 mg | ORAL_CAPSULE | Freq: Three times a day (TID) | ORAL | 0 refills | Status: DC

## 2019-01-21 NOTE — ED Notes (Signed)
Contact number if positive for COVID. [phone]

## 2019-01-21 NOTE — ED Provider Notes (Signed)
[facility] EMERGENCY DEPARTMENT Provider Note   CSN: [id number] Arrival date & time: 01/21/19  1613     History   Chief Complaint Chief Complaint  Patient presents with  . URI    HPI George Schroeder is a 53 y.o. male.     The history is provided by the patient. No language interpreter was used.  URI Presenting symptoms: congestion and cough   Severity:  Moderate Onset quality:  Gradual Duration:  1 week Timing:  Constant Progression:  Worsening Chronicity:  New Relieved by:  Nothing Worsened by:  Nothing Ineffective treatments:  None tried Associated symptoms: no headaches and no sinus pain   Risk factors: no chronic respiratory disease and no sick contacts     Past Medical History:  Diagnosis Date  . Alcohol abuse   . Anxiety   . Essential hypertension, benign   . Hyperlipidemia   . TBI (traumatic brain injury) [facility]) age 83    Patient Active Problem List   Diagnosis Date Noted  . Chest pain 06/23/2017  . Syncope and collapse 06/23/2017  . Cardiac murmur 07/10/2013  . Sinus tachycardia 07/10/2013  . Screening for prostate cancer 05/02/2012  . Atypical chest pain 03/12/2012  . Left foot pain 03/12/2012  . Onychomycosis 01/01/2012  . Low back pain 10/22/2011  . Traumatic partial tear of right biceps tendon 04/26/2011  . Alcohol abuse 12/14/2010  . 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 Hyperlipidemia 06/25/2009  . OTHER NONSPECIFIC ABNORMAL SERUM ENZYME LEVELS 06/25/2009  . 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 HYPERTENSION, BENIGN SYSTEMIC 05/11/2006  . BPH 05/11/2006    Past Surgical History:  Procedure Laterality Date  . FACIAL RECONSTRUCTION SURGERY          Home Medications    Prior to Admission medications   Medication Sig Start Date End Date Taking? Authorizing Provider  hydrochlorothiazide (HYDRODIURIL) 25 MG tablet Take 1 tablet (25 mg total) by mouth daily. 02/17/18   Fransico Meadow, PA-C   lisinopril-hydrochlorothiazide (PRINZIDE) 20-12.5 MG tablet Take 1 tablet by mouth daily. 11/21/18 12/21/18  Rancour, Annie Main, MD  metoprolol tartrate (LOPRESSOR) 25 MG tablet Take 1 tablet (25 mg total) by mouth 2 (two) times daily. 11/21/18 12/21/18  Rancour, Annie Main, MD  predniSONE (DELTASONE) 20 MG tablet 2 tabs po daily x 3 days 09/06/17   Milton Ferguson, MD  simvastatin (ZOCOR) 20 MG tablet Take 1 tablet (20 mg total) by mouth at bedtime. 06/24/17 10/22/17  Heath Lark D, DO    Family History Family History  Problem Relation Age of Onset  . Hypertension Mother   . Alcohol abuse Father   . Heart disease Father   . Stroke Father   . Cancer Father     Social History Social History   Tobacco Use  . Smoking status: Former Smoker    Years: 10.00    Types: Cigarettes    Quit date: 03/14/1993    Years since quitting: 25.8  . Smokeless tobacco: Never Used  Substance Use Topics  . Alcohol use: Not Currently    Comment: occasional - hx alcoholism  . Drug use: No     Allergies   Patient has no known allergies.   Review of Systems Review of Systems  HENT: Positive for congestion. Negative for sinus pain.   Respiratory: Positive for cough.   Neurological: Negative for headaches.  All other systems reviewed and are negative.    Physical Exam Updated Vital Signs BP 131/83 (BP Location: Right  Arm)   Pulse 71   Temp 97.9 F (36.6 C) (Oral)   Resp 14   Ht 5\' 8"  (1.727 m)   Wt 73.5 kg   SpO2 99%   BMI 24.63 kg/m   Physical Exam Vitals signs and nursing note reviewed.  Constitutional:      Appearance: He is well-developed.  HENT:     Head: Normocephalic and atraumatic.     Nose: Nose normal.     Mouth/Throat:     Mouth: Mucous membranes are moist.     Pharynx: Oropharynx is clear.  Eyes:     Conjunctiva/sclera: Conjunctivae normal.  Neck:     Musculoskeletal: Neck supple.  Cardiovascular:     Rate and Rhythm: Normal rate and regular rhythm.     Heart sounds: No  murmur.  Pulmonary:     Effort: Pulmonary effort is normal. No respiratory distress.     Breath sounds: Normal breath sounds.  Abdominal:     Palpations: Abdomen is soft.     Tenderness: There is no abdominal tenderness.  Skin:    General: Skin is warm and dry.  Neurological:     General: No focal deficit present.     Mental Status: He is alert.  Psychiatric:        Mood and Affect: Mood normal.      ED Treatments / Results  Labs (all labs ordered are listed, but only abnormal results are displayed) Labs Reviewed  SARS CORONAVIRUS 2 (TAT 6-24 HRS)    EKG None  Radiology No results found.  Procedures Procedures (including critical care time)  Medications Ordered in ED Medications - No data to display   Initial Impression / Assessment and Plan / ED Course  I have reviewed the triage vital signs and the nursing notes.  Pertinent labs & imaging results that were available during my care of the patient were reviewed by me and considered in my medical decision making (see chart for details).        George Schroeder was evaluated in Emergency Department on 01/21/2019 for the symptoms described in the history of present illness. He was evaluated in the context of the global COVID-19 pandemic, which necessitated consideration that the patient might be at risk for infection with the SARS-CoV-2 virus that causes COVID-19. Institutional protocols and algorithms that pertain to the evaluation of patients at risk for COVID-19 are in a state of rapid change based on information released by regulatory bodies including the CDC and federal and state organizations. These policies and algorithms were followed during the patient's care in the ED.  Final Clinical Impressions(s) / ED Diagnoses  MDM   Pt has had uri symptoms for over a week, no current fever.  Covid test ordered.  Pt advised of need to quarantine.  Final diagnoses:  Upper respiratory tract infection, unspecified type     ED Discharge Orders         Ordered    benzonatate (TESSALON) 100 MG capsule  Every 8 hours     01/21/19 1927        An After Visit Summary was printed and given to the patient.    13/09/20, Elson Areas 01/21/19 13/09/20    Kristopher Oppenheim, MD 01/23/19 [phone]

## 2019-01-21 NOTE — ED Triage Notes (Signed)
Pt states that he has been having a uri infection pt states that he is coughing up yellow stuff.

## 2019-01-22 LAB — SARS CORONAVIRUS 2 (TAT 6-24 HRS): SARS Coronavirus 2: NEGATIVE

## 2019-01-29 ENCOUNTER — Telehealth: Payer: Self-pay | Admitting: *Deleted

## 2019-01-29 NOTE — Telephone Encounter (Signed)
Patient called and given negative COVID results, patient verbalized understanding.

## 2019-03-27 ENCOUNTER — Ambulatory Visit: Payer: BC Managed Care – PPO | Attending: Internal Medicine

## 2019-03-27 ENCOUNTER — Other Ambulatory Visit: Payer: Self-pay

## 2019-03-27 DIAGNOSIS — Z20822 Contact with and (suspected) exposure to covid-19: Secondary | ICD-10-CM

## 2019-03-27 DIAGNOSIS — H5203 Hypermetropia, bilateral: Secondary | ICD-10-CM | POA: Diagnosis not present

## 2019-03-28 LAB — NOVEL CORONAVIRUS, NAA: SARS-CoV-2, NAA: NOT DETECTED

## 2019-03-29 ENCOUNTER — Telehealth: Payer: Self-pay

## 2019-03-29 NOTE — Telephone Encounter (Signed)
Patient called and he was informed that his test for COVID-19 03/27/19 was negative. He request his result fax to # [phone] attn Angie Pullian.

## 2019-03-29 NOTE — Telephone Encounter (Signed)
Called patient to verify fax number.  Attemt to fax has failed. Patient will check number on Monday and will call back with proper fax number.

## 2019-04-01 ENCOUNTER — Telehealth: Payer: Self-pay | Admitting: *Deleted

## 2019-04-01 NOTE — Telephone Encounter (Signed)
Pt called back to give correct fax number [phone] ATTN: Vangie Bicker

## 2019-04-01 NOTE — Telephone Encounter (Signed)
Requesting copy of negative Covid 19 results via fax #[phone] to his employer.  Address to Attn: angie pulliam.

## 2019-04-01 NOTE — Telephone Encounter (Signed)
Per pt. Request, faxed COVID result to employer; attnVangie Bicker @ [phone].

## 2019-07-13 ENCOUNTER — Other Ambulatory Visit: Payer: Self-pay

## 2019-07-13 ENCOUNTER — Ambulatory Visit: Payer: BC Managed Care – PPO | Attending: Internal Medicine

## 2019-07-13 DIAGNOSIS — Z23 Encounter for immunization: Secondary | ICD-10-CM

## 2019-07-13 NOTE — Progress Notes (Signed)
   Covid-19 Vaccination Clinic  Name:  George Schroeder    MRN: [id number] DOB: 07-30-1965  07/13/2019  George Schroeder was observed post Covid-19 immunization for 15 minutes without incident. He was provided with Vaccine Information Sheet and instruction to access the V-Safe system.   George Schroeder was instructed to call 911 with any severe reactions post vaccine: Marland Kitchen Difficulty breathing  . Swelling of face and throat  . A fast heartbeat  . A bad rash all over body  . Dizziness and weakness   Immunizations Administered    Name Date Dose VIS Date Route   Moderna COVID-19 Vaccine 07/13/2019 11:50 AM 0.5 mL 02/2019 Intramuscular   Manufacturer: Moderna   Lot: [id number]   NDC: 41324-401-02

## 2019-07-23 ENCOUNTER — Ambulatory Visit: Payer: BC Managed Care – PPO

## 2019-08-14 ENCOUNTER — Other Ambulatory Visit: Payer: Self-pay

## 2019-08-17 ENCOUNTER — Ambulatory Visit: Payer: BC Managed Care – PPO

## 2019-08-17 DIAGNOSIS — Z23 Encounter for immunization: Secondary | ICD-10-CM

## 2019-08-17 NOTE — Progress Notes (Signed)
   Covid-19 Vaccination Clinic  Name:  George Schroeder    MRN: [id number] DOB: 1965-09-03  08/17/2019  [name] was observed post Covid-19 immunization for 15 minutes without incident. He was provided with Vaccine Information Sheet and instruction to access the V-Safe system.   [name] was instructed to call 911 with any severe reactions post vaccine: Marland Kitchen Difficulty breathing  . Swelling of face and throat  . A fast heartbeat  . A bad rash all over body  . Dizziness and weakness   Immunizations Administered    Name Date Dose VIS Date Route   Moderna COVID-19 Vaccine 08/17/2019 11:03 AM 0.5 mL 02/2019 Intramuscular   Manufacturer: Moderna   Lot: [id number]   NDC: 62694-854-62    .cov

## 2019-08-20 ENCOUNTER — Encounter: Payer: Self-pay | Admitting: Nurse Practitioner

## 2019-08-20 ENCOUNTER — Telehealth: Payer: Self-pay

## 2019-08-20 ENCOUNTER — Other Ambulatory Visit: Payer: Self-pay

## 2019-08-20 ENCOUNTER — Ambulatory Visit (INDEPENDENT_AMBULATORY_CARE_PROVIDER_SITE_OTHER): Payer: BC Managed Care – PPO | Admitting: Nurse Practitioner

## 2019-08-20 VITALS — BP 164/86 | HR 80 | Temp 97.6°F | Resp 18 | Ht 67.5 in | Wt 170.4 lb

## 2019-08-20 DIAGNOSIS — I10 Essential (primary) hypertension: Secondary | ICD-10-CM

## 2019-08-20 DIAGNOSIS — Z0000 Encounter for general adult medical examination without abnormal findings: Secondary | ICD-10-CM | POA: Diagnosis not present

## 2019-08-20 DIAGNOSIS — Z1211 Encounter for screening for malignant neoplasm of colon: Secondary | ICD-10-CM

## 2019-08-20 DIAGNOSIS — Z125 Encounter for screening for malignant neoplasm of prostate: Secondary | ICD-10-CM | POA: Diagnosis not present

## 2019-08-20 DIAGNOSIS — Z1321 Encounter for screening for nutritional disorder: Secondary | ICD-10-CM | POA: Diagnosis not present

## 2019-08-20 DIAGNOSIS — E785 Hyperlipidemia, unspecified: Secondary | ICD-10-CM

## 2019-08-20 MED ORDER — LISINOPRIL-HYDROCHLOROTHIAZIDE 20-12.5 MG PO TABS: 1.0000 | ORAL_TABLET | Freq: Every day | ORAL | 3 refills | Status: AC

## 2019-08-20 MED ORDER — SIMVASTATIN 20 MG PO TABS: 20.0000 mg | ORAL_TABLET | Freq: Every day | ORAL | 3 refills | Status: DC

## 2019-08-20 MED ORDER — LISINOPRIL-HYDROCHLOROTHIAZIDE 20-12.5 MG PO TABS: 1.0000 | ORAL_TABLET | Freq: Every day | ORAL | 3 refills | Status: DC

## 2019-08-20 MED ORDER — SIMVASTATIN 20 MG PO TABS: 20.0000 mg | ORAL_TABLET | Freq: Every day | ORAL | 3 refills | Status: AC

## 2019-08-20 NOTE — Telephone Encounter (Signed)
Cologuard ordered via [employer].  Order number (Order [id number])

## 2019-08-20 NOTE — Patient Instructions (Addendum)
General adult exam Establish Care Labs today nurse will call with result Drink plenty of water Eat low carb, low fat, low cholesterol, low sugar diet Get at least 20 minutes of exercise 4 times per week Pt reported COVID vaccine completed 07/2019, 08/2019 Tdap last 5 years Ordered Cologuard for colon cancer screen Ordered Shingrex CBC diff and CMP lab draw Lab results will be called with instructions Blood pressure goals 140/90 or less taken 3 hours after taking medications

## 2019-08-20 NOTE — Progress Notes (Signed)
New Patient Office Visit  Subjective:  Patient ID: George Schroeder, male    DOB: [birth year]  Age: 54 y.o. MRN: [id number]  CC:  Chief Complaint  Patient presents with  . Establish Care    HPI George Schroeder is a 54 year old male presenting with adult exam and to establish care.  The patient has had his.  He has had his Tdap in the past 5 years.  He would like a prescription for Shingrix sent to his pharmacy and a refill on his medications.  The patient would like to complete his lab work.  The patient would like a Cologuard screening for colorectal cancer screening has never had any colorectal cancer screening before. The patient wears upper partials.  Denies chest pain, chest tightness,gu/gi symptoms, pain, shortness of breath, edema, palpitations, or falls.  Past Medical History:  Diagnosis Date  . Alcohol abuse   . Anxiety   . Essential hypertension, benign   . Hyperlipidemia   . TBI (traumatic brain injury) [facility]) age 57    Past Surgical History:  Procedure Laterality Date  . FACIAL RECONSTRUCTION SURGERY      Family History  Problem Relation Age of Onset  . Hypertension Mother   . Alcohol abuse Father   . Heart disease Father   . Stroke Father   . Cancer Father     Social History   Socioeconomic History  . Marital status: Single    Spouse name: Not on file  . Number of children: Not on file  . Years of education: Not on file  . Highest education level: Not on file  Occupational History  . Occupation: Company secretary  Tobacco Use  . Smoking status: Former Smoker    Years: 10.00    Types: Cigarettes    Quit date: 03/14/1993    Years since quitting: 26.4  . Smokeless tobacco: Never Used  Substance and Sexual Activity  . Alcohol use: Not Currently    Comment: occasional - hx alcoholism  . Drug use: No  . Sexual activity: Yes  Other Topics Concern  . Not on file  Social History Narrative   Lives with brother.    Sister came to clinic today.    Griffin Basil [phone]   Social Determinants of Health   Financial Resource Strain:   . Difficulty of Paying Living Expenses:   Food Insecurity:   . Worried About Programme researcher, broadcasting/film/video in the Last Year:   . Barista in the Last Year:   Transportation Needs:   . Freight forwarder (Medical):   Marland Kitchen Lack of Transportation (Non-Medical):   Physical Activity:   . Days of Exercise per Week:   . Minutes of Exercise per Session:   Stress:   . Feeling of Stress :   Social Connections:   . Frequency of Communication with Friends and Family:   . Frequency of Social Gatherings with Friends and Family:   . Attends Religious Services:   . Active Member of Clubs or Organizations:   . Attends Banker Meetings:   Marland Kitchen Marital Status:   Intimate Partner Violence:   . Fear of Current or Ex-Partner:   . Emotionally Abused:   Marland Kitchen Physically Abused:   . Sexually Abused:     ROS Review of Systems  All other systems reviewed and are negative.   Objective:   Today's Vitals: BP (!) 164/86 (BP Location: Left Arm, Patient Position: Sitting, Cuff Size: Normal)  Pulse 80   Temp 97.6 F (36.4 C) (Temporal)   Resp 18   Ht 5' 7.5" (1.715 m)   Wt 170 lb 6.4 oz (77.3 kg)   SpO2 99%   BMI 26.29 kg/m   Physical Exam Vitals and nursing note reviewed.  Constitutional:      Appearance: Normal appearance. He is well-developed and well-groomed.  HENT:     Head: Normocephalic.     Right Ear: Hearing and external ear normal.     Left Ear: Hearing and external ear normal.     Nose: Nose normal.     Mouth/Throat:     Lips: Pink.     Mouth: Mucous membranes are moist.     Dentition: Abnormal dentition. Has dentures. Dental caries present.     Pharynx: Oropharynx is clear.     Comments: Upper partials present Eyes:     General: Lids are normal. Lids are everted, no foreign bodies appreciated.     Conjunctiva/sclera: Conjunctivae normal.     Pupils: Pupils are equal, round, and reactive to  light.  Neck:     Thyroid: No thyromegaly or thyroid tenderness.     Vascular: No carotid bruit or JVD.  Cardiovascular:     Rate and Rhythm: Normal rate and regular rhythm.     Pulses: Normal pulses.     Heart sounds: Normal heart sounds, S1 normal and S2 normal.  Pulmonary:     Effort: Pulmonary effort is normal.     Breath sounds: Normal breath sounds.  Chest:     Chest wall: No deformity.  Abdominal:     General: Abdomen is flat. Bowel sounds are normal. There is no distension or abdominal bruit.     Palpations: There is no hepatomegaly or splenomegaly.     Tenderness: There is no guarding.     Hernia: No hernia is present.  Musculoskeletal:        General: No swelling. Normal range of motion.     Cervical back: Full passive range of motion without pain, normal range of motion and neck supple.     Right lower leg: No edema.     Left lower leg: No edema.  Lymphadenopathy:     Head:     Right side of head: No submental, submandibular, tonsillar or occipital adenopathy.     Left side of head: No submental, submandibular, tonsillar or occipital adenopathy.     Cervical: No cervical adenopathy.  Skin:    General: Skin is warm.     Capillary Refill: Capillary refill takes less than 2 seconds.     Coloration: Skin is not jaundiced.     Findings: No bruising.  Neurological:     General: No focal deficit present.     Mental Status: He is alert and oriented to person, place, and time.     Cranial Nerves: Cranial nerves are intact.     Motor: Motor function is intact.     Deep Tendon Reflexes: Reflexes are normal and symmetric.  Psychiatric:        Attention and Perception: Attention normal.        Mood and Affect: Mood normal.        Speech: Speech normal.        Behavior: Behavior normal. Behavior is cooperative.        Thought Content: Thought content normal.        Cognition and Memory: Cognition normal.        Judgment: Judgment normal.  Assessment & Plan:  General  adult exam Establish Care Labs today nurse will call with result Drink plenty of water Eat low carb, low fat, low cholesterol, low sugar diet Get at least 20 minutes of exercise 4 times per week Pt reported COVID vaccine completed 07/2019, 08/2019 Tdap last 5 years Ordered Cologuard for colon cancer screen Ordered Shingrex CBC diff and CMP lab draw Lab results will be called with instructions Blood pressure goals 140/90 or less taken 3 hours after taking medications  Problem List Items Addressed This Visit      Cardiovascular and Mediastinum   HYPERTENSION, BENIGN SYSTEMIC   Relevant Medications   lisinopril-hydrochlorothiazide (PRINZIDE) 20-12.5 MG tablet   simvastatin (ZOCOR) 20 MG tablet     Other   Hyperlipidemia - Primary   Relevant Medications   lisinopril-hydrochlorothiazide (PRINZIDE) 20-12.5 MG tablet   simvastatin (ZOCOR) 20 MG tablet   Other Relevant Orders   Lipid panel    Other Visit Diagnoses    Prostate cancer screening       Relevant Orders   PSA   Encounter for vitamin deficiency screening       Relevant Orders   VITAMIN D 25 Hydroxy (Vit-D Deficiency, Fractures)   Colon cancer screening       Relevant Orders   Cologuard      Outpatient Encounter Medications as of 08/20/2019  Medication Sig  . lisinopril-hydrochlorothiazide (PRINZIDE) 20-12.5 MG tablet Take 1 tablet by mouth daily.  . simvastatin (ZOCOR) 20 MG tablet Take 1 tablet (20 mg total) by mouth at bedtime.  . [DISCONTINUED] hydrochlorothiazide (HYDRODIURIL) 25 MG tablet Take 1 tablet (25 mg total) by mouth daily.  . [DISCONTINUED] lisinopril-hydrochlorothiazide (PRINZIDE) 20-12.5 MG tablet Take 1 tablet by mouth daily.  . [DISCONTINUED] lisinopril-hydrochlorothiazide (PRINZIDE) 20-12.5 MG tablet Take 1 tablet by mouth daily.  . [DISCONTINUED] simvastatin (ZOCOR) 20 MG tablet Take 1 tablet (20 mg total) by mouth at bedtime.  . [DISCONTINUED] simvastatin (ZOCOR) 20 MG tablet Take 1 tablet (20  mg total) by mouth at bedtime.  . [DISCONTINUED] metoprolol tartrate (LOPRESSOR) 25 MG tablet Take 1 tablet (25 mg total) by mouth 2 (two) times daily.   No facility-administered encounter medications on file as of 08/20/2019.    Follow-up: Return in about 1 year (around 08/19/2020) for labs one week prior.   Elmore Guise, FNP

## 2019-08-21 ENCOUNTER — Other Ambulatory Visit: Payer: Self-pay | Admitting: Nurse Practitioner

## 2019-08-21 DIAGNOSIS — E559 Vitamin D deficiency, unspecified: Secondary | ICD-10-CM

## 2019-08-21 DIAGNOSIS — R972 Elevated prostate specific antigen [PSA]: Secondary | ICD-10-CM

## 2019-08-21 MED ORDER — VITAMIN D3 1.25 MG (50000 UT) PO TABS: 1.0000 | ORAL_TABLET | ORAL | 0 refills | Status: DC

## 2019-08-21 NOTE — Progress Notes (Signed)
Vitamin d is very low. I prescribed a weekly dose for 8 weeks then he should take 1000 iu otc daily, repeat lab in 3 months  Psa was on upper end of normal, repeat lab in 3 months  Watch cholesterol and fat in diet as cholesterol elevated, also get exercise as instructed  Liver enzyme slightly up will monitor watch alcohol and tylenols  Sugar a bit elevated add A1C to lab please

## 2019-08-23 LAB — TEST AUTHORIZATION

## 2019-08-23 LAB — CBC WITH DIFFERENTIAL/PLATELET
Absolute Monocytes: 644 cells/uL (ref 200–950)
Basophils Absolute: 39 cells/uL (ref 0–200)
Basophils Relative: 0.7 %
Eosinophils Absolute: 218 cells/uL (ref 15–500)
Eosinophils Relative: 3.9 %
HCT: 40.7 % (ref 38.5–50.0)
Hemoglobin: 13.8 g/dL (ref 13.2–17.1)
Lymphs Abs: 1730 cells/uL (ref 850–3900)
MCH: 30.9 pg (ref 27.0–33.0)
MCHC: 33.9 g/dL (ref 32.0–36.0)
MCV: 91.3 fL (ref 80.0–100.0)
MPV: 10.1 fL (ref 7.5–12.5)
Monocytes Relative: 11.5 %
Neutro Abs: 2968 cells/uL (ref 1500–7800)
Neutrophils Relative %: 53 %
Platelets: 270 10*3/uL (ref 140–400)
RBC: 4.46 10*6/uL (ref 4.20–5.80)
RDW: 13.1 % (ref 11.0–15.0)
Total Lymphocyte: 30.9 %
WBC: 5.6 10*3/uL (ref 3.8–10.8)

## 2019-08-23 LAB — COMPLETE METABOLIC PANEL WITHOUT GFR
AG Ratio: 1.4 (calc) (ref 1.0–2.5)
ALT: 37 U/L (ref 9–46)
AST: 40 U/L — ABNORMAL HIGH (ref 10–35)
Albumin: 4.3 g/dL (ref 3.6–5.1)
Alkaline phosphatase (APISO): 82 U/L (ref 35–144)
BUN: 13 mg/dL (ref 7–25)
CO2: 28 mmol/L (ref 20–32)
Calcium: 9.2 mg/dL (ref 8.6–10.3)
Chloride: 102 mmol/L (ref 98–110)
Creat: 1.06 mg/dL (ref 0.70–1.33)
GFR, Est African American: 91 mL/min/{1.73_m2}
GFR, Est Non African American: 79 mL/min/{1.73_m2}
Globulin: 3 g/dL (ref 1.9–3.7)
Glucose, Bld: 100 mg/dL — ABNORMAL HIGH (ref 65–99)
Potassium: 4.3 mmol/L (ref 3.5–5.3)
Sodium: 140 mmol/L (ref 135–146)
Total Bilirubin: 0.3 mg/dL (ref 0.2–1.2)
Total Protein: 7.3 g/dL (ref 6.1–8.1)

## 2019-08-23 LAB — LIPID PANEL
Cholesterol: 215 mg/dL — ABNORMAL HIGH
HDL: 83 mg/dL
LDL Cholesterol (Calc): 116 mg/dL — ABNORMAL HIGH
Non-HDL Cholesterol (Calc): 132 mg/dL — ABNORMAL HIGH
Total CHOL/HDL Ratio: 2.6 (calc)
Triglycerides: 68 mg/dL

## 2019-08-23 LAB — PSA: PSA: 3.4 ng/mL

## 2019-08-23 LAB — VITAMIN D 25 HYDROXY (VIT D DEFICIENCY, FRACTURES): Vit D, 25-Hydroxy: 14 ng/mL — ABNORMAL LOW (ref 30–100)

## 2019-08-23 LAB — HEMOGLOBIN A1C W/OUT EAG: Hgb A1c MFr Bld: 5.7 % of total Hgb — ABNORMAL HIGH (ref <5.7)

## 2019-09-03 ENCOUNTER — Other Ambulatory Visit: Payer: Self-pay

## 2019-09-03 MED ORDER — CHOLECALCIFEROL 1.25 MG (50000 UT) PO CAPS: 50000.0000 [IU] | ORAL_CAPSULE | ORAL | 1 refills | Status: AC

## 2020-08-24 ENCOUNTER — Encounter: Payer: BC Managed Care – PPO | Admitting: Nurse Practitioner

## 2021-02-13 IMAGING — CT CT ANGIO CHEST
2 of 7 series · 18 of 46 positions shown · IV contrast (omnipaque)
Comparison: 06/15/2016

CLINICAL DATA: Bilateral feet swelling.  Positive D-dimer.

EXAM:
CT ANGIOGRAPHY CHEST WITH CONTRAST
TECHNIQUE: Multidetector CT imaging of the chest was performed using the
standard protocol during bolus administration of intravenous
contrast. Multiplanar CT image reconstructions and MIPs were
obtained to evaluate the vascular anatomy.
CONTRAST:  100mL OMNIPAQUE IOHEXOL 350 MG/ML SOLN

[Series 5: pe axial thins · axial · 0.68mm/px · z∈[-238,+28]mm · 15 of 304 slices shown]
[im 19/304  lung]
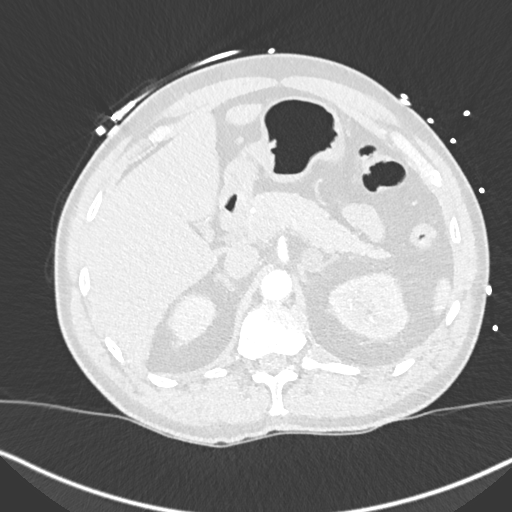
[im 38/304  soft-tissue]
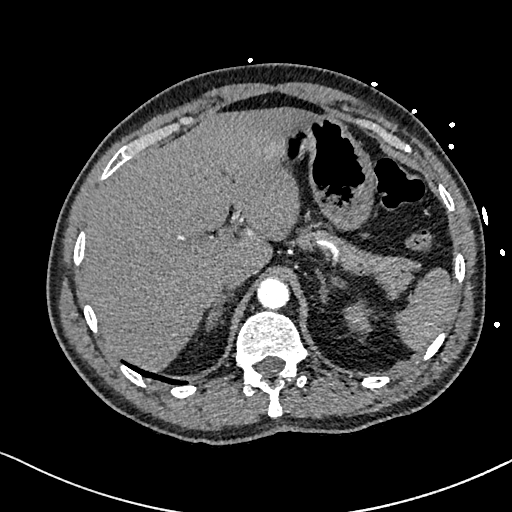
[im 57/304  lung]
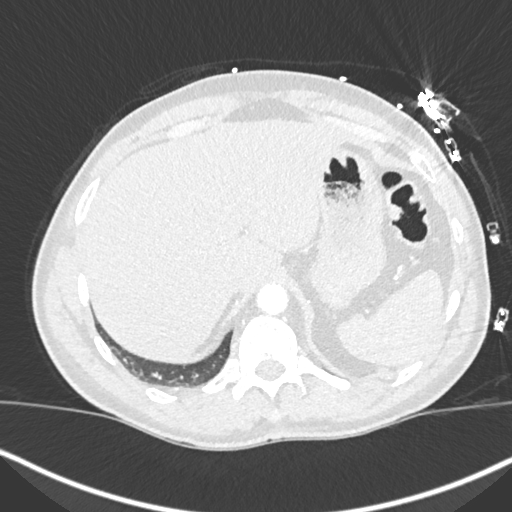
[im 76/304  soft-tissue]
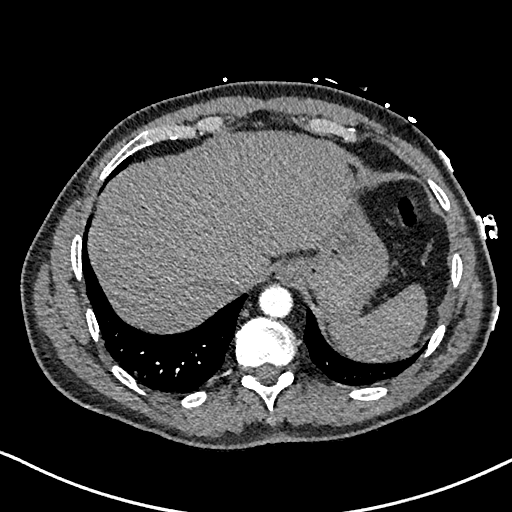
[im 95/304  lung]
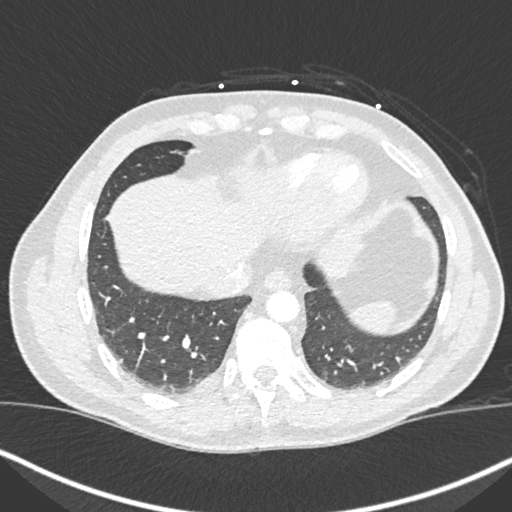
[im 114/304  soft-tissue]
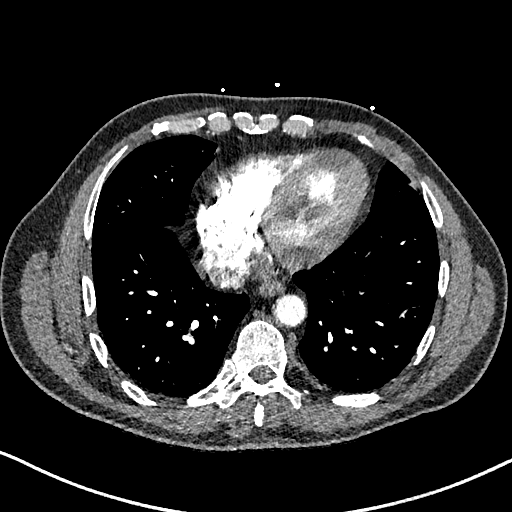
[im 133/304  lung]
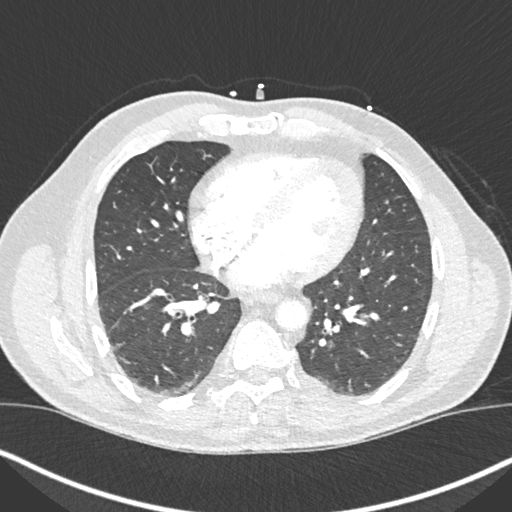
[im 152/304  soft-tissue]
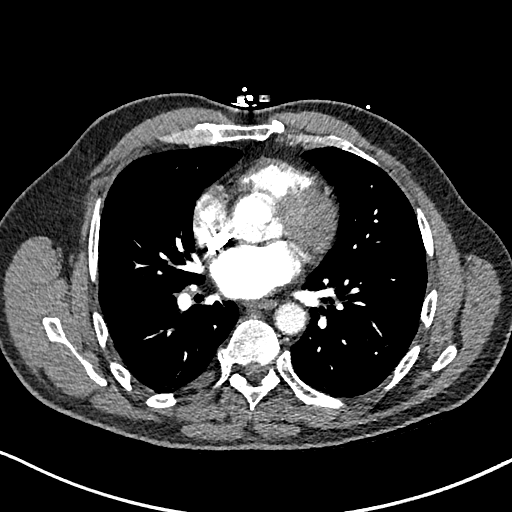
[im 171/304  lung]
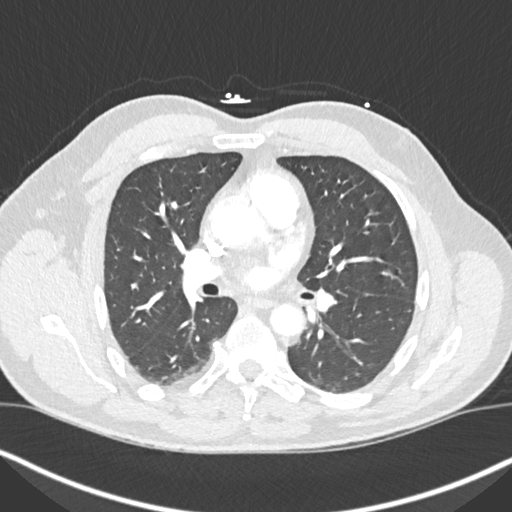
[im 190/304  soft-tissue]
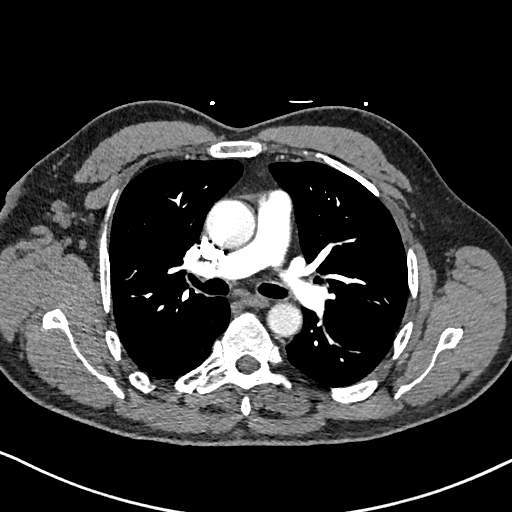
[im 209/304  lung]
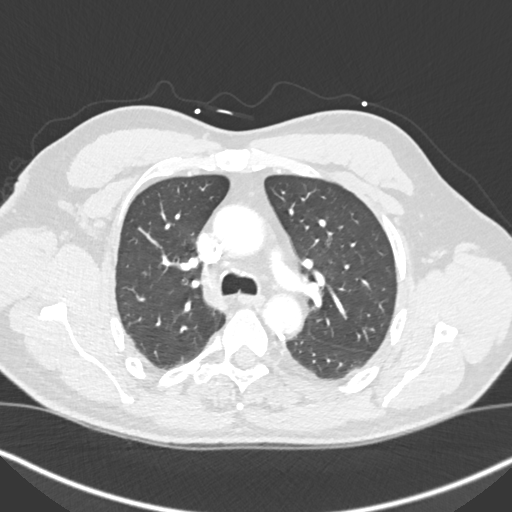
[im 228/304  soft-tissue]
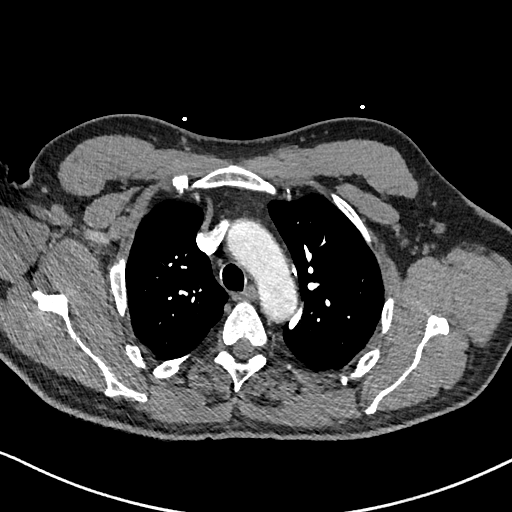
[im 247/304  lung]
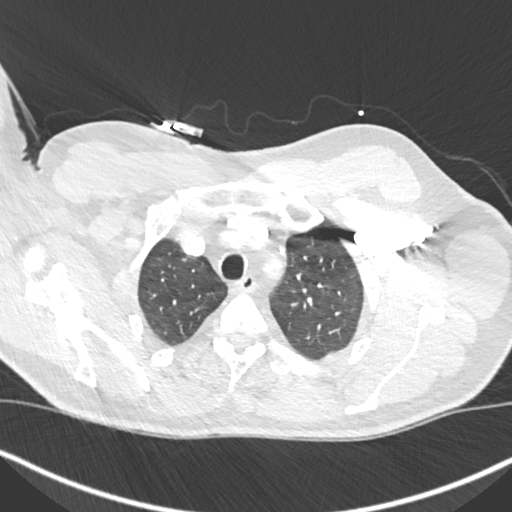
[im 266/304  soft-tissue]
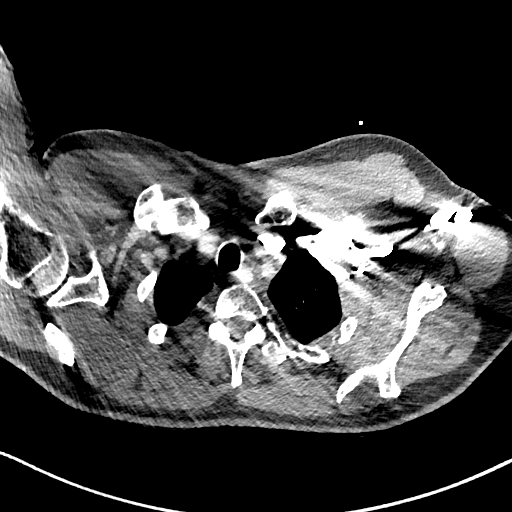
[im 285/304  lung]
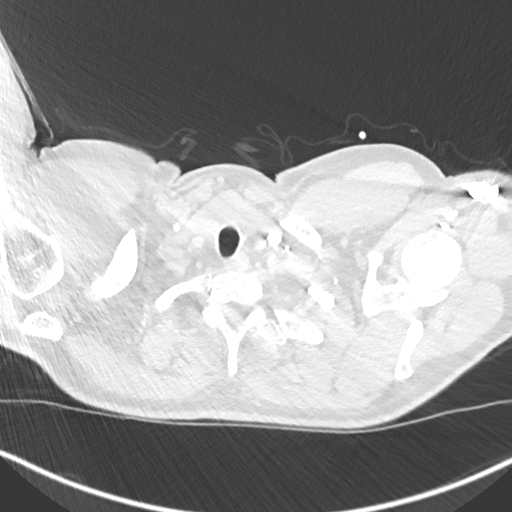

[Series 8: cor soft · coronal · 0.56mm/px · 3 of 129 slices shown]
[im 33/129  soft-tissue]
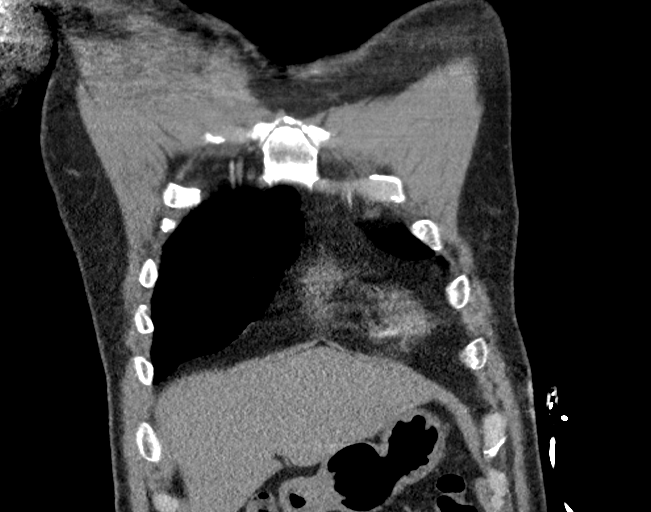
[im 65/129  soft-tissue]
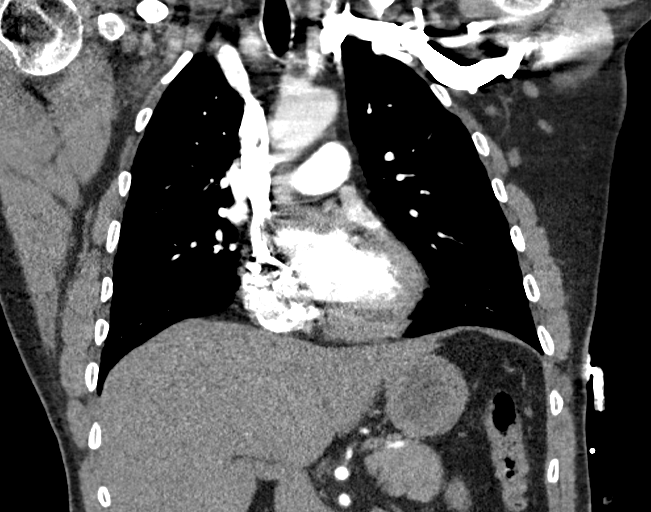
[im 97/129  soft-tissue]
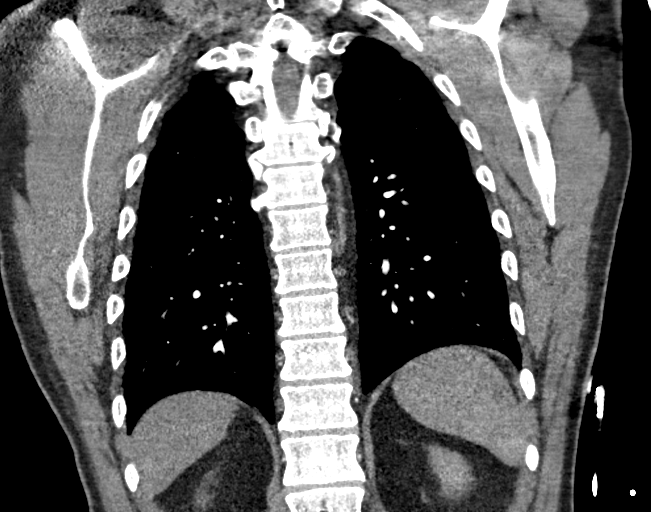

[18 of 46 positions shown; findings below may reference images not displayed]

FINDINGS: Cardiovascular: No filling defects in the pulmonary arteries to
suggestpulmonary emboli. Heart is normal size. Aorta is normal
caliber.

Mediastinum/Nodes: No mediastinal, hilar, or axillary adenopathy.
Small bilateral axillary lymph nodes, likely reactive. Trachea and
esophagus are unremarkable. Thyroid unremarkable.

Lungs/Pleura: Tiny right apical nodule, 2-3 mm on image 27 is stable
since 3693 study. No confluent opacities or effusions.

Upper Abdomen: Imaging into the upper abdomen shows no acute
findings.

Musculoskeletal: Chest wall soft tissues are unremarkable. No acute
bony abnormality.

Review of the MIP images confirms the above findings.
IMPRESSION: No evidence of pulmonary embolus.

No acute cardiopulmonary disease.

## 2021-02-18 IMAGING — US US EXTREM LOW VENOUS
1 series · 13 of 24 positions shown · non-contrast
Comparison: Left lower extremity venous Doppler
ultrasound-08/14/2016

CLINICAL DATA: Bilateral lower extremity pain and edema. Evaluate
for DVT.



[Series 1: us extrem low venous · 0.08mm/px · 13 of 89 slices shown]
[im 1/89]
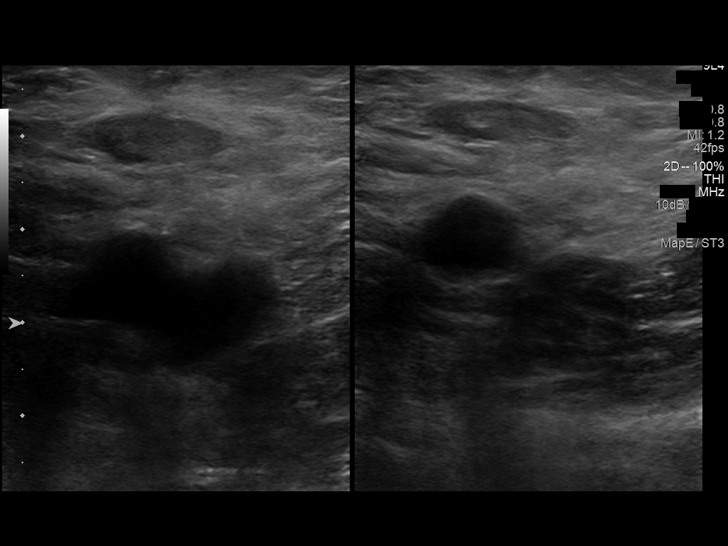
[im 8/89]
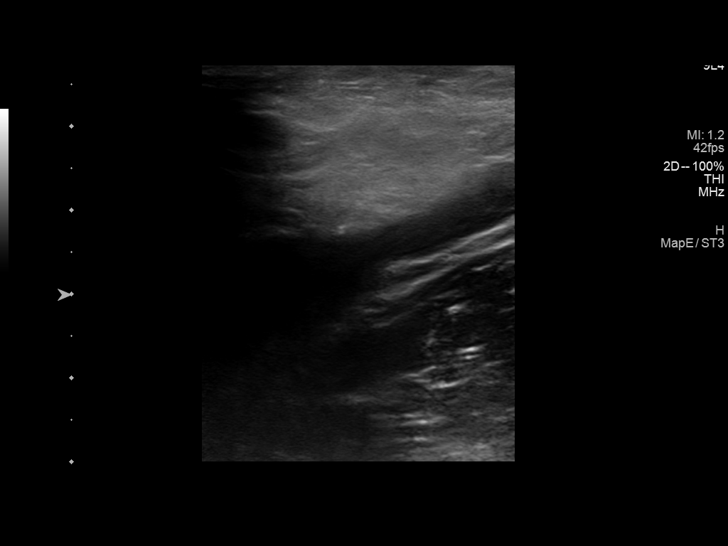
[im 16/89]
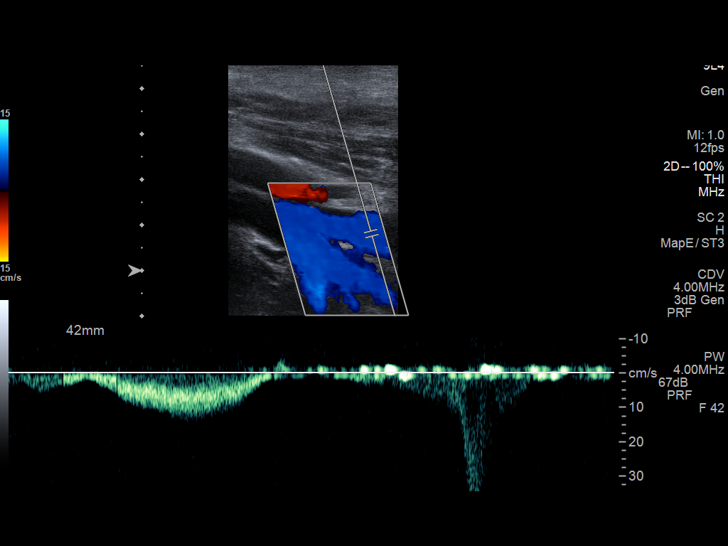
[im 23/89]
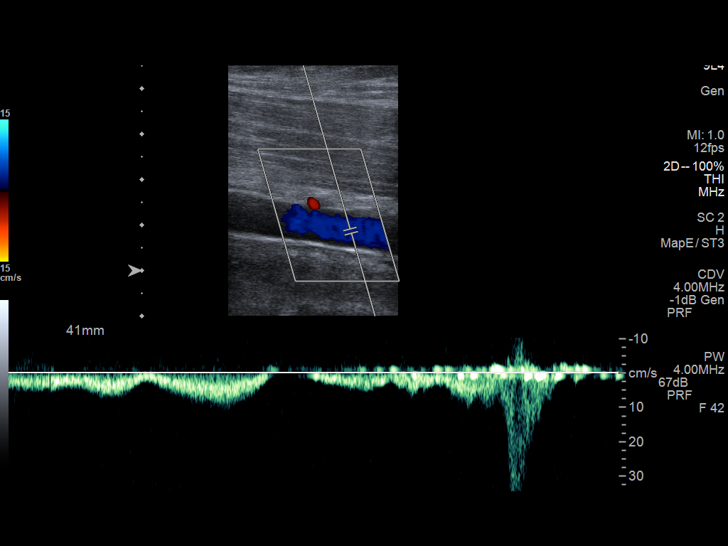
[im 31/89]
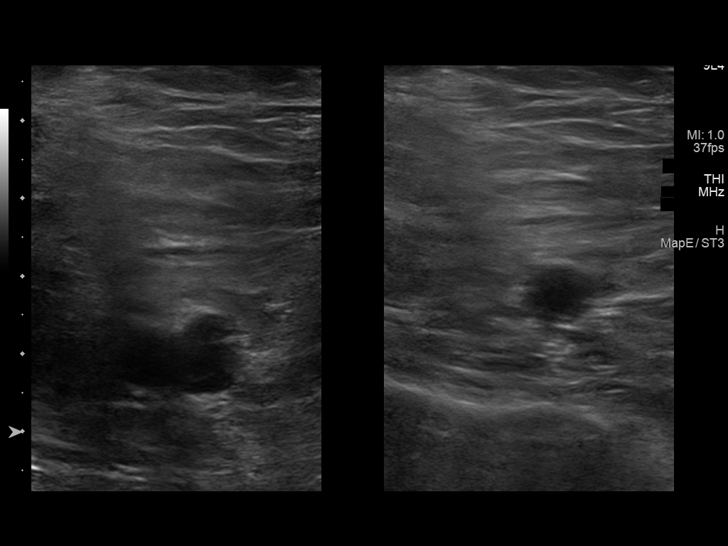
[im 39/89]
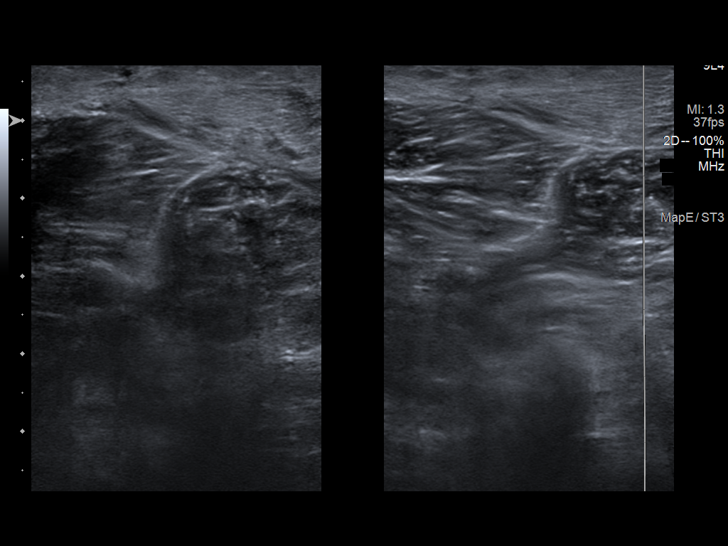
[im 46/89]
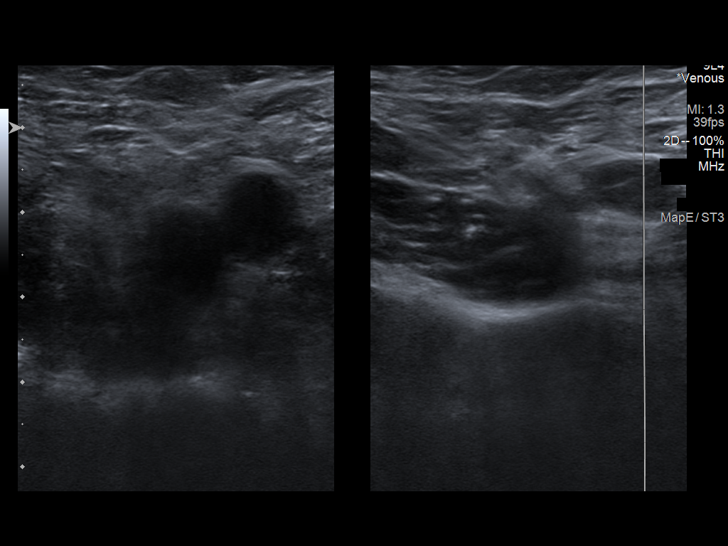
[im 50/89]
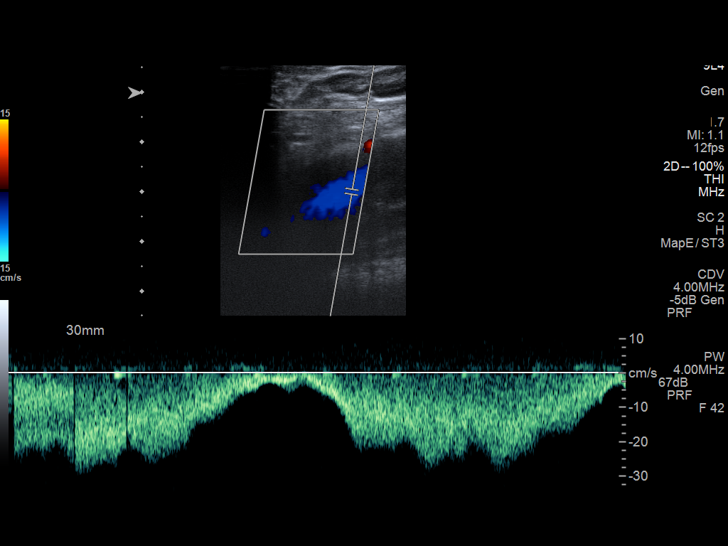
[im 58/89]
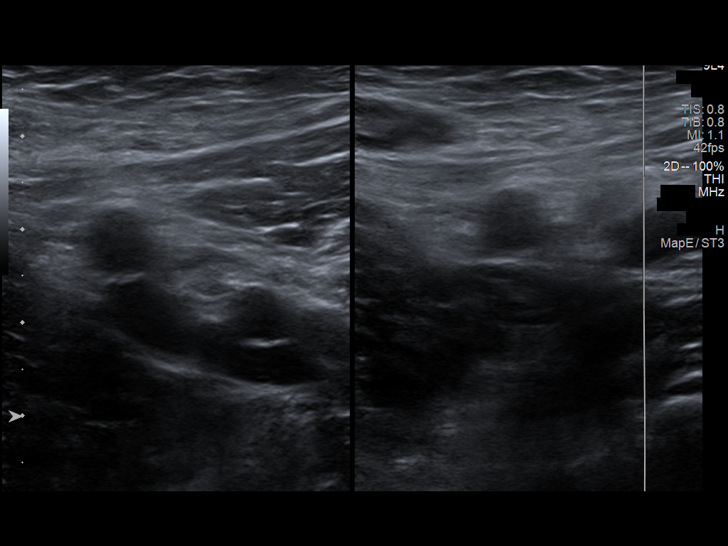
[im 66/89]
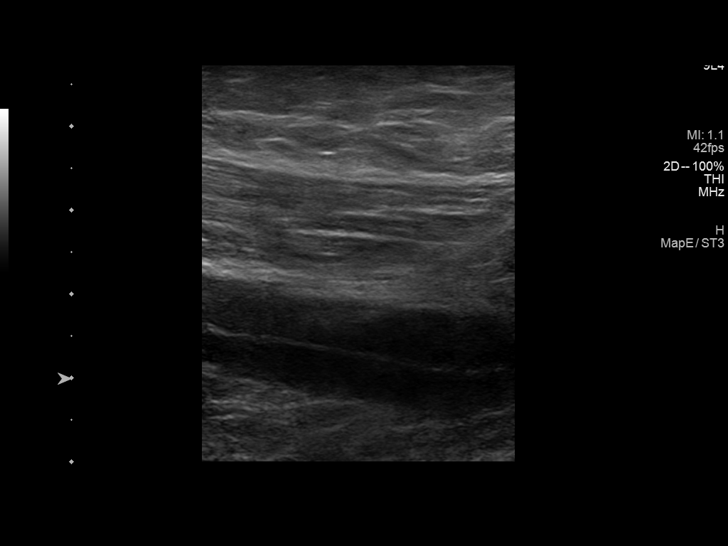
[im 73/89]
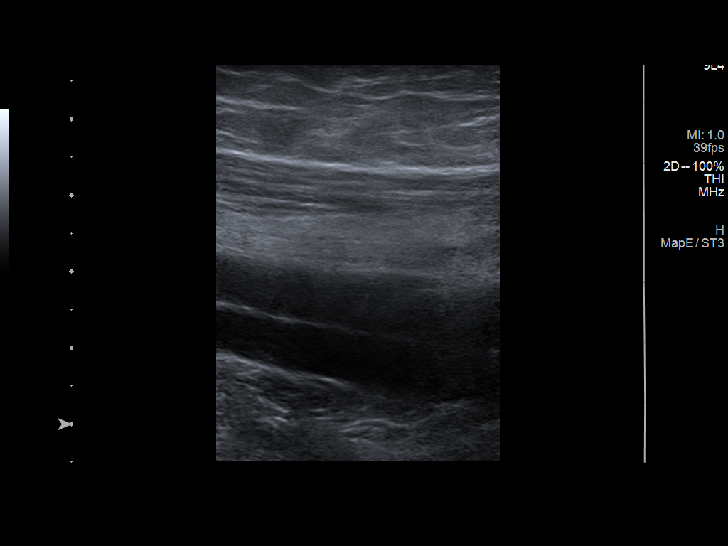
[im 81/89]
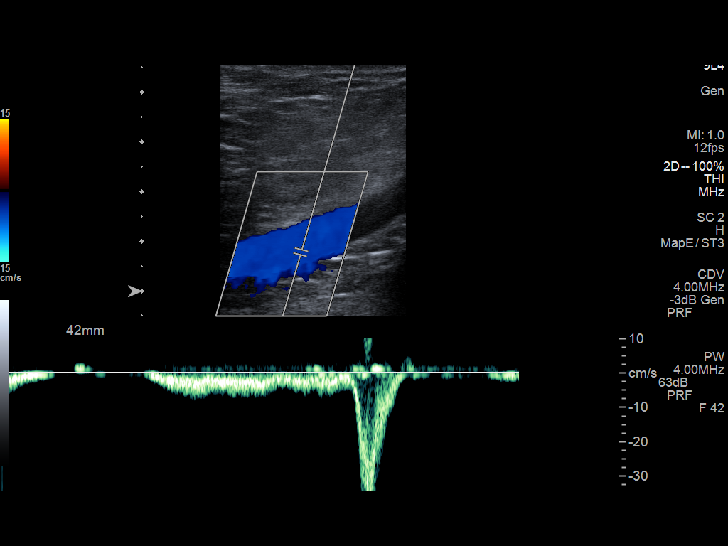
[im 89/89]
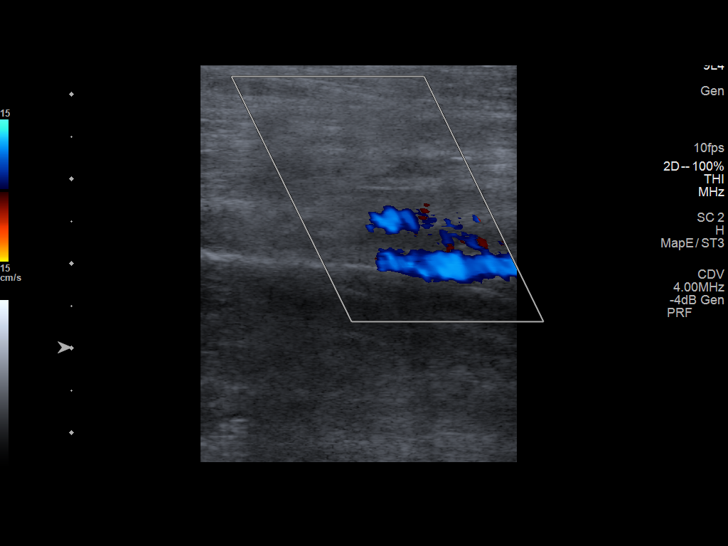

[13 of 24 positions shown; findings below may reference images not displayed]

FINDINGS: RIGHT LOWER EXTREMITY

Common Femoral Vein: No evidence of thrombus. Normal
compressibility, respiratory phasicity and response to augmentation.

Saphenofemoral Junction: No evidence of thrombus. Normal
compressibility and flow on color Doppler imaging.

Profunda Femoral Vein: No evidence of thrombus. Normal
compressibility and flow on color Doppler imaging.

Femoral Vein: No evidence of thrombus. Normal compressibility,
respiratory phasicity and response to augmentation.

Popliteal Vein: No evidence of thrombus. Normal compressibility,
respiratory phasicity and response to augmentation.

Calf Veins: No evidence of thrombus. Normal compressibility and flow
on color Doppler imaging.

Superficial Great Saphenous Vein: No evidence of thrombus. Normal
compressibility.

Venous Reflux:  None.

Other Findings:  None.

LEFT LOWER EXTREMITY

Common Femoral Vein: No evidence of thrombus. Normal
compressibility, respiratory phasicity and response to augmentation.

Saphenofemoral Junction: No evidence of thrombus. Normal
compressibility and flow on color Doppler imaging.

Profunda Femoral Vein: No evidence of thrombus. Normal
compressibility and flow on color Doppler imaging.

Femoral Vein: No evidence of thrombus. Normal compressibility,
respiratory phasicity and response to augmentation.

Popliteal Vein: No evidence of thrombus. Normal compressibility,
respiratory phasicity and response to augmentation.

Calf Veins: No evidence of thrombus. Normal compressibility and flow
on color Doppler imaging.

Superficial Great Saphenous Vein: No evidence of thrombus. Normal
compressibility.

Venous Reflux:  None.

Other Findings:  None.
IMPRESSION: No evidence of DVT within either lower extremity.
